# Patient Record
Sex: Female | Born: 1935 | Race: White | Hispanic: No
Health system: Southern US, Community
[De-identification: ages and names within clinical notes are randomized; demographics above are authoritative.]

## PROBLEM LIST (undated history)

## (undated) DIAGNOSIS — E162 Hypoglycemia, unspecified: Secondary | ICD-10-CM

## (undated) DIAGNOSIS — K219 Gastro-esophageal reflux disease without esophagitis: Secondary | ICD-10-CM

## (undated) DIAGNOSIS — C801 Malignant (primary) neoplasm, unspecified: Secondary | ICD-10-CM

## (undated) DIAGNOSIS — F319 Bipolar disorder, unspecified: Secondary | ICD-10-CM

---

## 2009-07-20 ENCOUNTER — Inpatient Hospital Stay: Payer: Self-pay | Admitting: Internal Medicine

## 2009-07-20 ENCOUNTER — Ambulatory Visit: Payer: Self-pay | Admitting: Family Medicine

## 2009-09-20 ENCOUNTER — Ambulatory Visit: Payer: Self-pay | Admitting: Family Medicine

## 2014-10-29 ENCOUNTER — Ambulatory Visit: Payer: Self-pay | Admitting: Internal Medicine

## 2014-11-23 ENCOUNTER — Inpatient Hospital Stay: Payer: Self-pay | Admitting: Internal Medicine

## 2014-11-23 LAB — COMPREHENSIVE METABOLIC PANEL
ANION GAP: 12 (ref 7–16)
Albumin: 3.6 g/dL (ref 3.4–5.0)
Alkaline Phosphatase: 97 U/L (ref 46–116)
BILIRUBIN TOTAL: 0.8 mg/dL (ref 0.2–1.0)
BUN: 21 mg/dL — AB (ref 7–18)
CREATININE: 0.89 mg/dL (ref 0.60–1.30)
Calcium, Total: 8.8 mg/dL (ref 8.5–10.1)
Chloride: 105 mmol/L (ref 98–107)
Co2: 24 mmol/L (ref 21–32)
EGFR (African American): >60
EGFR (Non-African Amer.): >60
GLUCOSE: 119 mg/dL — AB (ref 65–99)
Osmolality: 283 (ref 275–301)
POTASSIUM: 4 mmol/L (ref 3.5–5.1)
SGOT(AST): 50 U/L — ABNORMAL HIGH (ref 15–37)
SGPT (ALT): 34 U/L (ref 14–63)
Sodium: 141 mmol/L (ref 136–145)
TOTAL PROTEIN: 6.8 g/dL (ref 6.4–8.2)

## 2014-11-23 LAB — URINALYSIS, COMPLETE
Bacteria: NONE SEEN
Bilirubin,UR: NEGATIVE
Blood: NEGATIVE
Glucose,UR: NEGATIVE mg/dL (ref 0–75)
KETONE: NEGATIVE
Leukocyte Esterase: NEGATIVE
Nitrite: NEGATIVE
Ph: 5 (ref 4.5–8.0)
RBC,UR: <1 /HPF (ref 0–5)
Specific Gravity: 1.011 (ref 1.003–1.030)
Squamous Epithelial: NONE SEEN
WBC UR: <1 /HPF (ref 0–5)

## 2014-11-23 LAB — CBC WITH DIFFERENTIAL/PLATELET
Basophil #: 0.1 10*3/uL (ref 0.0–0.1)
Basophil %: 0.3 %
EOS ABS: 0.2 10*3/uL (ref 0.0–0.7)
Eosinophil %: 0.5 %
HCT: 35.6 % (ref 35.0–47.0)
HGB: 11.2 g/dL — ABNORMAL LOW (ref 12.0–16.0)
LYMPHS ABS: 23.9 10*3/uL — AB (ref 1.0–3.6)
LYMPHS PCT: 66.4 %
MCH: 28.9 pg (ref 26.0–34.0)
MCHC: 31.5 g/dL — AB (ref 32.0–36.0)
MCV: 92 fL (ref 80–100)
MONOS PCT: 5.6 %
Monocyte #: 2 x10 3/mm — ABNORMAL HIGH (ref 0.2–0.9)
NEUTROS ABS: 9.8 10*3/uL — AB (ref 1.4–6.5)
NEUTROS PCT: 27.2 %
PLATELETS: 249 10*3/uL (ref 150–440)
RBC: 3.88 10*6/uL (ref 3.80–5.20)
RDW: 22.1 % — ABNORMAL HIGH (ref 11.5–14.5)
WBC: 35 10*3/uL — AB (ref 3.6–11.0)

## 2014-11-23 LAB — PROTIME-INR
INR: 1
Prothrombin Time: 13.3 secs (ref 11.5–14.7)

## 2014-11-24 LAB — TSH: Thyroid Stimulating Horm: 0.428 u[IU]/mL — ABNORMAL LOW

## 2014-11-25 LAB — CBC WITH DIFFERENTIAL/PLATELET
Bands: 2 %
HCT: 32.9 % — ABNORMAL LOW (ref 35.0–47.0)
HGB: 10.4 g/dL — AB (ref 12.0–16.0)
Lymphocytes: 67 %
MCH: 28.8 pg (ref 26.0–34.0)
MCHC: 31.5 g/dL — ABNORMAL LOW (ref 32.0–36.0)
MCV: 91 fL (ref 80–100)
MONOS PCT: 3 %
Metamyelocyte: 1 %
Platelet: 226 10*3/uL (ref 150–440)
RBC: 3.6 10*6/uL — ABNORMAL LOW (ref 3.80–5.20)
RDW: 22.1 % — ABNORMAL HIGH (ref 11.5–14.5)
Segmented Neutrophils: 22 %
VARIANT LYMPHOCYTE - H1-RLYMPH: 5 %
WBC: 28.3 10*3/uL — ABNORMAL HIGH (ref 3.6–11.0)

## 2014-11-25 LAB — BASIC METABOLIC PANEL
Anion Gap: 12 (ref 7–16)
BUN: 13 mg/dL (ref 7–18)
CALCIUM: 8.9 mg/dL (ref 8.5–10.1)
CHLORIDE: 101 mmol/L (ref 98–107)
Co2: 26 mmol/L (ref 21–32)
Creatinine: 0.9 mg/dL (ref 0.60–1.30)
EGFR (African American): >60
EGFR (Non-African Amer.): >60
Glucose: 120 mg/dL — ABNORMAL HIGH (ref 65–99)
Osmolality: 279 (ref 275–301)
Potassium: 3 mmol/L — ABNORMAL LOW (ref 3.5–5.1)
Sodium: 139 mmol/L (ref 136–145)

## 2014-11-26 LAB — MAGNESIUM: MAGNESIUM: 1.4 mg/dL — AB

## 2014-11-27 LAB — CBC WITH DIFFERENTIAL/PLATELET
HCT: 31.9 % — ABNORMAL LOW (ref 35.0–47.0)
HGB: 10 g/dL — ABNORMAL LOW (ref 12.0–16.0)
Lymphocytes: 64 %
MCH: 28.4 pg (ref 26.0–34.0)
MCHC: 31.2 g/dL — ABNORMAL LOW (ref 32.0–36.0)
MCV: 91 fL (ref 80–100)
MYELOCYTE: 1 %
Monocytes: 3 %
PLATELETS: 202 10*3/uL (ref 150–440)
Promyelocyte: 1 %
RBC: 3.51 10*6/uL — ABNORMAL LOW (ref 3.80–5.20)
RDW: 21.6 % — AB (ref 11.5–14.5)
Segmented Neutrophils: 31 %
WBC: 26.9 10*3/uL — ABNORMAL HIGH (ref 3.6–11.0)

## 2014-11-28 LAB — CULTURE, BLOOD (SINGLE)

## 2014-11-29 ENCOUNTER — Ambulatory Visit: Payer: Self-pay | Admitting: Internal Medicine

## 2014-11-29 LAB — CBC WITH DIFFERENTIAL/PLATELET
HCT: 34 % — AB (ref 35.0–47.0)
HGB: 10.6 g/dL — AB (ref 12.0–16.0)
Lymphocytes: 66 %
MCH: 28.5 pg (ref 26.0–34.0)
MCHC: 31.3 g/dL — AB (ref 32.0–36.0)
MCV: 91 fL (ref 80–100)
MONOS PCT: 3 %
Platelet: 198 10*3/uL (ref 150–440)
RBC: 3.73 10*6/uL — ABNORMAL LOW (ref 3.80–5.20)
RDW: 22.1 % — ABNORMAL HIGH (ref 11.5–14.5)
Segmented Neutrophils: 31 %
WBC: 28.1 10*3/uL — ABNORMAL HIGH (ref 3.6–11.0)

## 2014-11-29 LAB — CREATININE, SERUM
Creatinine: 0.7 mg/dL (ref 0.60–1.30)
EGFR (African American): >60
EGFR (Non-African Amer.): >60

## 2014-11-29 LAB — MAGNESIUM: Magnesium: 1.9 mg/dL

## 2014-11-29 LAB — POTASSIUM: POTASSIUM: 3.2 mmol/L — AB (ref 3.5–5.1)

## 2014-12-08 ENCOUNTER — Emergency Department: Payer: Self-pay | Admitting: Emergency Medicine

## 2014-12-09 ENCOUNTER — Emergency Department: Payer: Self-pay | Admitting: Emergency Medicine

## 2014-12-10 ENCOUNTER — Emergency Department: Payer: Self-pay | Admitting: Emergency Medicine

## 2014-12-28 ENCOUNTER — Ambulatory Visit
Admit: 2014-12-28 | Disposition: A | Discharge status: Home / Self Care | Payer: Self-pay | Attending: Internal Medicine | Admitting: Internal Medicine

## 2015-01-10 ENCOUNTER — Emergency Department: Payer: Self-pay | Admitting: Emergency Medicine

## 2015-01-28 ENCOUNTER — Emergency Department
Admit: 2015-01-28 | Disposition: A | Discharge status: Skilled Nursing Facility | Payer: Self-pay | Admitting: Emergency Medicine

## 2015-01-28 LAB — COMPREHENSIVE METABOLIC PANEL
ALK PHOS: 94 U/L
ALT: 15 U/L
Albumin: 3 g/dL — ABNORMAL LOW
Anion Gap: 7 (ref 7–16)
BILIRUBIN TOTAL: 0.6 mg/dL
BUN: 37 mg/dL — ABNORMAL HIGH
CREATININE: 1.18 mg/dL — AB
Calcium, Total: 8.8 mg/dL — ABNORMAL LOW
Chloride: 100 mmol/L — ABNORMAL LOW
Co2: 34 mmol/L — ABNORMAL HIGH
EGFR (African American): 45 — ABNORMAL LOW
GFR CALC NON AF AMER: 39 — AB
GLUCOSE: 120 mg/dL — AB
POTASSIUM: 3.8 mmol/L
SGOT(AST): 23 U/L
Sodium: 141 mmol/L
Total Protein: 5.7 g/dL — ABNORMAL LOW

## 2015-01-28 LAB — CBC
HCT: 25.5 % — ABNORMAL LOW (ref 35.0–47.0)
HGB: 8.1 g/dL — ABNORMAL LOW (ref 12.0–16.0)
MCH: 28.5 pg (ref 26.0–34.0)
MCHC: 31.6 g/dL — AB (ref 32.0–36.0)
MCV: 90 fL (ref 80–100)
Platelet: 246 10*3/uL (ref 150–440)
RBC: 2.83 10*6/uL — ABNORMAL LOW (ref 3.80–5.20)
RDW: 21.1 % — ABNORMAL HIGH (ref 11.5–14.5)
WBC: 19 10*3/uL — ABNORMAL HIGH (ref 3.6–11.0)

## 2015-01-28 LAB — URINALYSIS, COMPLETE
BACTERIA: NONE SEEN
Bilirubin,UR: NEGATIVE
Blood: NEGATIVE
Glucose,UR: NEGATIVE mg/dL (ref 0–75)
Ketone: NEGATIVE
LEUKOCYTE ESTERASE: NEGATIVE
Nitrite: NEGATIVE
PH: 7 (ref 4.5–8.0)
Protein: NEGATIVE
RBC,UR: <1 /HPF (ref 0–5)
SPECIFIC GRAVITY: 1.012 (ref 1.003–1.030)
Squamous Epithelial: 1
WBC UR: <1 /HPF (ref 0–5)

## 2015-01-28 LAB — TROPONIN I: TROPONIN-I: 0.03 ng/mL

## 2015-02-02 LAB — CULTURE, BLOOD (SINGLE)

## 2015-02-27 NOTE — H&P (Signed)
PATIENT NAME:  Amy Hartman, Amy Hartman MR#:  [id number] DATE OF BIRTH:  Mar 24, 1918  DATE OF ADMISSION:  11/23/2014   REFERRING PHYSICIAN: Nance Pear, MD   PRIMARY CARE PHYSICIAN: Nonlocal.   ADMITTING DIAGNOSIS: Healthcare-associated pneumonia and hypothermia.   HISTORY OF PRESENT ILLNESS: This is a 81 year old Caucasian female who presents to the Emergency Department via EMS after being found wandering outside. The patient was nearly unresponsive upon arrival to the Emergency Department and had a core temperature of 94.1 degrees Fahrenheit.  The patient was immediately placed on the warming blanket. A chest x-ray revealed a right-sided middle lobe pneumonia and the patient was also placed on supplemental oxygen therapy.  Her son states the patient had been complaining of shortness of breath for the last 10 days.  She had some confusion during that time, but it was not dramatic.  Unfortunately, this is her third episode of wandering outside of her independent living apartment.  She has essentially been in her usual state of health lately, aside from complaining of insomnia.   REVIEW OF SYSTEMS:  CONSTITUTIONAL: The patient denies fever, but admits to generalized weakness.  EYES: Denies inflammation, but admits to poor visual acuity.  HEENT: Denies tinnitus or sore throat.  RESPIRATORY: Admits to shortness of breath and mild cough, which is nonproductive.   CARDIOVASCULAR: Denies chest pain, palpitations, orthopnea, paroxysmal nocturnal dyspnea. GASTROINTESTINAL: Denies nausea, vomiting, diarrhea, or abdominal pain.  GENITOURINARY: Denies dysuria, increased frequency, or hesitancy of urination. HEMATOLOGIC AND LYMPHATIC: Denies easy bruising or bleeding.  INTEGUMENTARY: Denies rashes or lesions.  MUSCULOSKELETAL: Denies arthralgias or myalgias.  NEUROLOGIC: Denies numbness in her extremities or difficulty speaking.   PSYCHIATRIC: Denies depression or suicidal ideation.   PAST MEDICAL HISTORY:  Hypertension, hypothyroidism, hyperlipidemia, macular degeneration, gait instability and known left bundle branch block.   PAST SURGICAL HISTORY: The patient had a left total knee replacement, varicose vein removal and repair, as well as a cholecystectomy.   SOCIAL HISTORY: The patient does not smoke or do any illegal drugs. She occasionally has a glass of wine and is able to complete her activities of daily living on her own. She lives in the independent living facility.   FAMILY HISTORY: Significant for hypertension.   MEDICATIONS:  1.  Aspirin 81 mg 1 tablet p.o. daily.  2.  Calcium supplement.  3.  Levothyroxine dose is unclear at this time.  4.  Lipitor 20 mg 1 tablet p.o. at bedtime.  5.  Lisinopril 20 mg 1 tablet p.o. daily.  6.  Maxzide 75 mg/50 mg oral tablet 1 tablet p.o. daily.  7.  Metoprolol, dose is unclear at this time.  8.  Multivitamin 1 tab p.o. daily.  9.  Nitroglycerin 0.4 mg sublingual tablet, 1 tablet p.o. every 5 minutes as needed for chest pain.  10. Zantac 150 mg delayed-release capsule 1 tablet p.o. b.i.d.   ALLERGIES: CODEINE.   PERTINENT LABORATORY RESULTS AND RADIOGRAPHIC FINDINGS: Serum glucose 119, BUN 21, creatinine 0.89, serum sodium 141, potassium 4, chloride 105, bicarbonate 24, calcium 8.8, serum albumin, 3.6, alkaline phosphatase 97, AST 50, ALT 34. White blood cell count 35, hemoglobin is 11.2, hematocrit 35.6, platelet count is 249,000.  MCV is 92. Urinalysis is negative for infection. Chest x-ray shows cardiac enlargement with pulmonary vascular congestion, perihilar edema and bilateral pleural effusions. There is some atelectasis or infiltration in the lung bases. CT of the head shows involutional changes, mild to moderate white matter changes consistent with chronic small vessel ischemic disease, but there  is no acute intracranial process.   PHYSICAL EXAMINATION:  VITAL SIGNS: Temperature is 94.1, pulse 72, respirations 24, blood pressure 155/82,  pulse oximetry is 78% on room air, which improves to 100% on 3 liters of oxygen via nasal cannula.  GENERAL: The patient is somnolent, but easily arousable. She is in no apparent distress.  HEENT: Normocephalic, atraumatic. Pupils equal, round, and reactive to light and accommodation. Extraocular movements are intact. Mucous membranes are moist.  NECK: Trachea is midline. No adenopathy. Thyroid is nonpalpable, nontender.  CHEST: Symmetric and atraumatic.  CARDIOVASCULAR: Regular rate and rhythm. Normal S1, S2. No rubs, clicks, or murmurs appreciated.  LUNGS: Somewhat diminished breath sounds bilaterally and on the right has some faint rhonchi. She is on nasal cannula at the time of physical examination.  ABDOMEN: Positive bowel sounds. Soft, nontender, nondistended. No hepatosplenomegaly.  GENITOURINARY: Deferred.  MUSCULOSKELETAL: The patient moves all 4 extremities equally but has 4/5 strength in upper and lower extremities bilaterally. This could be a finding due to decreased cooperation given her general somnolence and overall feeling of weakness.  SKIN: No rashes or lesions.  EXTREMITIES: No clubbing or cyanosis. The patient does have some nonpitting edema of her lower extremities to the knees.  NEUROLOGIC: Cranial nerves II through XII are grossly intact.  PSYCHIATRIC: Mood is normal. Affect is congruent.  The patient has good insight into her illness; however, it is difficult to assess her judgment given her state of confusion that led her to be outside in the first place.   ASSESSMENT AND PLAN: This is a 81 year old female with pneumonia and hypothermia.  1.  Pneumonia. The patient lives independently, but in a group apartment setting, thus we will treat her for healthcare-associated pneumonia.  In the Emergency Department, she was given Levaquin, vancomycin and Zosyn.  We will continue the latter 2 and switch the patient to oral azithromycin to substitute for Levaquin. We will give her  supplemental oxygen by nasal cannula and wean as tolerated.  We will continue to keep the patient on telemetry.  2.  Hypothermia. This is likely environmental, but in combination with the patient's leukocytosis. She does not meet septic criteria.  Blood cultures have been obtained. The patient may have a component of hypoxia secondary to her pneumonia which led to the confusion that may have led to her being outside, or her macular degeneration may have contributed to her inability to find her way back in the house at night, but it is clear that based on the freezing temperature outside this evening is certainly part of her low temperature is environmental.  3.  Hypothyroidism. We will continue Synthroid as soon as we verify the patient's dose.  4.  Hyperlipidemia. Will continue statin therapy.  5.  Deep vein thrombosis prophylaxis, heparin.  6.  Gastrointestinal prophylaxis: None.   CODE STATUS: The patient is a full code.   TIME SPENT ON ADMISSION ORDERS AND CRITICAL PATIENT CARE: Approximately 45 minutes.     ____________________________ Norva Riffle. Marcille Blanco, MD msd:DT D: 11/23/2014 06:50:26 ET T: 11/23/2014 08:28:26 ET JOB#: [id number]  cc: Norva Riffle. Marcille Blanco, MD, <Dictator> Debbrah Alar, MD [place] MD ELECTRONICALLY SIGNED 11/24/2014 1:58

## 2015-02-27 NOTE — Discharge Summary (Signed)
PATIENT NAME:  Amy Hartman, Amy Hartman MR#:  [id number] DATE OF BIRTH:  05/21/1918  DATE OF ADMISSION:  11/23/2014 DATE OF DISCHARGE:  11/30/2014  DISCHARGE DIAGNOSES: 1.  Bilateral community-acquired pneumonia.  2. Acute respiratory failure.  3.  Acute toxic metabolic encephalopathy.  4.  Dementia.  5.  Dehydration.  6.  Hypokalemia.  7.  Hypothyroidism.  8.  Gastroesophageal reflux disease.  9.  Hypertension.   DISCHARGE MEDICATIONS: 1.  Levothyroxine 88 mcg daily.  2.  Lipitor 20 mg daily.  3.  Amlodipine 5 mg daily.  4.  Metoprolol extended release 100 mg oral once a day.  5.  Isosorbide mononitrate 30 mg daily.  6.  Nexium 40 mg daily.  7.  Olanzapine 5 mg oral once a day.  8.  Augmentin 875 mg oral 2 times a day for 5 days.  9.  Ensure 240 mL oral 3 times a day.  10.  DuoNeb nebulizer 4 times a day as needed for shortness of breath.    DISCHARGE INSTRUCTIONS: Continuous oxygen 3 liters through nasal cannula. Low-sodium diet. Activity as tolerated with a walker. Follow up with primary care physician in 1-2 weeks.   IMAGING STUDIES: Chest x-ray showed bilateral pneumonia.   ADMITTING HISTORY AND PHYSICAL: Please see detailed H and P dictated previously. In brief, a 81 year old Caucasian female patient presented to the hospital with hypothermia, unresponsiveness. Chest x-ray revealed bilateral pneumonia and was admitted to the hospitalist service.   HOSPITAL COURSE: 1.  Bilateral pneumonia. The patient was initially started on broad-spectrum antibiotics which were later tapered down to Augmentin. The patient has improved well, but continues to be on 3 liters oxygen and with acute respiratory failure. Her saturations were 84% on room air on the day of discharge and home oxygen has been set up at discharge.  2.  Acute encephalopathy over dementia secondary to her pneumonia. The patient has improved well and by the day of discharge the patient still is mildly confused, but awake, eating,  and able to carry a conversation with her family.  3.  Hypertension, fairly controlled, was elevated when she was unable to take her medications due to being n.p.o., but is improving.   Prior to discharge, the patient's lungs sound significantly improved with decreased air entry at the bases, S1, S2 heard without any murmurs, is alert, awake. No edema.   The patient was seen by physical therapy prior to discharge. Skilled nursing facility was recommended, but as the patient had a bed available at assisted living facility in [facility], family has requested that we discharge patient to assisted living with home health PT and they would be able to increase the physical therapy by self-pay if needed in the future.   TIME SPENT ON DAY OF DISCHARGE IN DISCHARGE ACTIVITY: 40 minutes.   ____________________________ Leia Alf Tristian Bouska, MD srs:LT D: 12/01/2014 17:18:27 ET T: 12/01/2014 21:21:53 ET JOB#: [id number]  cc: Alveta Heimlich R. Syrianna Schillaci, MD, <Dictator> Neita Carp MD ELECTRONICALLY SIGNED 12/06/2014 3:55

## 2015-02-27 NOTE — Consult Note (Signed)
   Comments   I met with pt's son. He describes a pattern of confusion worsening over the past six months. Pt likely has underlying dementia. TSH noted. Would check B12 to complete workup.  recognizes that pt has delirium and may not return to her previous cognitive baseline or functioning. He has made arrangements for patient to be admitted to [facility] at discharge and seems to feel they will be able to [facility] care for her even if she is nonambulatory and agitated. I discussed use of olanzapine and he agrees with this. He would also be interested in keeping her over the weekend to see if we can control her symptoms. Son would agree with rehab if needed.  that son will be out of country for the next week. He will have limited availability but will give the nurse his daughter's number as a contact person.  follow pt's status.  Electronic Signatures: Borders, Kirt Boys (NP)  (Signed 29-Jan-16 12:09)  Authored: Palliative Care Phifer, Izora Gala (MD)  (Signed 29-Jan-16 13:41)  Authored: Palliative Care   Last Updated: 29-Jan-16 13:41 by Phifer, Izora Gala (MD)

## 2015-04-16 ENCOUNTER — Encounter: Payer: Self-pay | Admitting: Emergency Medicine

## 2015-04-16 ENCOUNTER — Emergency Department: Payer: Medicare Other

## 2015-04-16 ENCOUNTER — Other Ambulatory Visit: Payer: Self-pay

## 2015-04-16 ENCOUNTER — Emergency Department
Admission: EM | Admit: 2015-04-16 | Discharge: 2015-04-17 | Disposition: A | Discharge status: Home / Self Care | Payer: Medicare Other | Attending: Emergency Medicine | Admitting: Emergency Medicine

## 2015-04-16 DIAGNOSIS — R4182 Altered mental status, unspecified: Secondary | ICD-10-CM | POA: Diagnosis present

## 2015-04-16 DIAGNOSIS — Z79899 Other long term (current) drug therapy: Secondary | ICD-10-CM | POA: Diagnosis not present

## 2015-04-16 DIAGNOSIS — S069X0A Unspecified intracranial injury without loss of consciousness, initial encounter: Secondary | ICD-10-CM | POA: Insufficient documentation

## 2015-04-16 DIAGNOSIS — Y9389 Activity, other specified: Secondary | ICD-10-CM | POA: Diagnosis not present

## 2015-04-16 DIAGNOSIS — Y9289 Other specified places as the place of occurrence of the external cause: Secondary | ICD-10-CM | POA: Diagnosis not present

## 2015-04-16 DIAGNOSIS — Y998 Other external cause status: Secondary | ICD-10-CM | POA: Diagnosis not present

## 2015-04-16 DIAGNOSIS — F0390 Unspecified dementia without behavioral disturbance: Secondary | ICD-10-CM | POA: Diagnosis not present

## 2015-04-16 HISTORY — DX: Hypoglycemia, unspecified: E16.2

## 2015-04-16 HISTORY — DX: Bipolar disorder, unspecified: F31.9

## 2015-04-16 HISTORY — DX: Gastro-esophageal reflux disease without esophagitis: K21.9

## 2015-04-16 LAB — CBC WITH DIFFERENTIAL/PLATELET
Basophils Absolute: 0.2 10*3/uL — ABNORMAL HIGH (ref 0–0.1)
Basophils Relative: 1 %
EOS PCT: 1 %
Eosinophils Absolute: 0.2 10*3/uL (ref 0–0.7)
HCT: 26 % — ABNORMAL LOW (ref 35.0–47.0)
Hemoglobin: 8.2 g/dL — ABNORMAL LOW (ref 12.0–16.0)
LYMPHS ABS: 10 10*3/uL — AB (ref 1.0–3.6)
Lymphocytes Relative: 52 %
MCH: 30.5 pg (ref 26.0–34.0)
MCHC: 31.7 g/dL — ABNORMAL LOW (ref 32.0–36.0)
MCV: 96.2 fL (ref 80.0–100.0)
MONOS PCT: 7 %
Monocytes Absolute: 1.4 10*3/uL — ABNORMAL HIGH (ref 0.2–0.9)
Neutro Abs: 7.5 10*3/uL — ABNORMAL HIGH (ref 1.4–6.5)
Neutrophils Relative %: 39 %
PLATELETS: 186 10*3/uL (ref 150–440)
RBC: 2.71 MIL/uL — AB (ref 3.80–5.20)
RDW: 22.2 % — AB (ref 11.5–14.5)
WBC: 19.3 10*3/uL — ABNORMAL HIGH (ref 3.6–11.0)

## 2015-04-16 LAB — URINALYSIS COMPLETE WITH MICROSCOPIC (ARMC ONLY)
BILIRUBIN URINE: NEGATIVE
Bacteria, UA: NONE SEEN
Glucose, UA: NEGATIVE mg/dL
Hgb urine dipstick: NEGATIVE
KETONES UR: NEGATIVE mg/dL
Nitrite: NEGATIVE
Protein, ur: NEGATIVE mg/dL
Specific Gravity, Urine: 1.014 (ref 1.005–1.030)
pH: 5 (ref 5.0–8.0)

## 2015-04-16 LAB — LIPASE, BLOOD: LIPASE: 33 U/L (ref 22–51)

## 2015-04-16 LAB — GLUCOSE, CAPILLARY: GLUCOSE-CAPILLARY: 166 mg/dL — AB (ref 65–99)

## 2015-04-16 LAB — BASIC METABOLIC PANEL
ANION GAP: 3 — AB (ref 5–15)
BUN: 46 mg/dL — ABNORMAL HIGH (ref 6–20)
CHLORIDE: 108 mmol/L (ref 101–111)
CO2: 31 mmol/L (ref 22–32)
Calcium: 9 mg/dL (ref 8.9–10.3)
Creatinine, Ser: 1.05 mg/dL — ABNORMAL HIGH (ref 0.44–1.00)
GFR calc Af Amer: 51 mL/min — ABNORMAL LOW (ref >60)
GFR calc non Af Amer: 44 mL/min — ABNORMAL LOW (ref >60)
Glucose, Bld: 156 mg/dL — ABNORMAL HIGH (ref 65–99)
Potassium: 3.9 mmol/L (ref 3.5–5.1)
Sodium: 142 mmol/L (ref 135–145)

## 2015-04-16 LAB — HEPATIC FUNCTION PANEL
ALT: 34 U/L (ref 14–54)
AST: 40 U/L (ref 15–41)
Albumin: 3.7 g/dL (ref 3.5–5.0)
Alkaline Phosphatase: 135 U/L — ABNORMAL HIGH (ref 38–126)
Bilirubin, Direct: <0.1 mg/dL — ABNORMAL LOW (ref 0.1–0.5)
TOTAL PROTEIN: 6.8 g/dL (ref 6.5–8.1)
Total Bilirubin: 0.5 mg/dL (ref 0.3–1.2)

## 2015-04-16 LAB — TROPONIN I

## 2015-04-16 MED ORDER — HALOPERIDOL LACTATE 5 MG/ML IJ SOLN: 5.0000 mg | Freq: Once | INTRAMUSCULAR | Status: DC

## 2015-04-16 MED ORDER — LORAZEPAM 2 MG/ML IJ SOLN: 2.0000 mg | Freq: Once | INTRAMUSCULAR | Status: DC

## 2015-04-16 MED ORDER — SODIUM CHLORIDE 0.9 % IV BOLUS (SEPSIS)
1000.0000 mL | Freq: Once | INTRAVENOUS | Status: AC
  Administered 2015-04-16: 1000 mL via INTRAVENOUS

## 2015-04-16 NOTE — Discharge Instructions (Signed)
Concussion A concussion, or closed-head injury, is a brain injury caused by a direct blow to the head or by a quick and sudden movement (jolt) of the head or neck. Concussions are usually not life-threatening. Even so, the effects of a concussion can be serious. If you have had a concussion before, you are more likely to experience concussion-like symptoms after a direct blow to the head.  CAUSES  Direct blow to the head, such as from running into another player during a soccer game, being hit in a fight, or hitting your head on a hard surface.  A jolt of the head or neck that causes the brain to move back and forth inside the skull, such as in a car crash. SIGNS AND SYMPTOMS The signs of a concussion can be hard to notice. Early on, they may be missed by you, family members, and health care providers. You may look fine but act or feel differently. Symptoms are usually temporary, but they may last for days, weeks, or even longer. Some symptoms may appear right away while others may not show up for hours or days. Every head injury is different. Symptoms include:  Mild to moderate headaches that will not go away.  A feeling of pressure inside your head.  Having more trouble than usual:  Learning or remembering things you have heard.  Answering questions.  Paying attention or concentrating.  Organizing daily tasks.  Making decisions and solving problems.  Slowness in thinking, acting or reacting, speaking, or reading.  Getting lost or being easily confused.  Feeling tired all the time or lacking energy (fatigued).  Feeling drowsy.  Sleep disturbances.  Sleeping more than usual.  Sleeping less than usual.  Trouble falling asleep.  Trouble sleeping (insomnia).  Loss of balance or feeling lightheaded or dizzy.  Nausea or vomiting.  Numbness or tingling.  Increased sensitivity to:  Sounds.  Lights.  Distractions.  Vision problems or eyes that tire  easily.  Diminished sense of taste or smell.  Ringing in the ears.  Mood changes such as feeling sad or anxious.  Becoming easily irritated or angry for little or no reason.  Lack of motivation.  Seeing or hearing things other people do not see or hear (hallucinations). DIAGNOSIS Your health care provider can usually diagnose a concussion based on a description of your injury and symptoms. He or she will ask whether you passed out (lost consciousness) and whether you are having trouble remembering events that happened right before and during your injury. Your evaluation might include:  A brain scan to look for signs of injury to the brain. Even if the test shows no injury, you may still have a concussion.  Blood tests to be sure other problems are not present. TREATMENT  Concussions are usually treated in an emergency department, in urgent care, or at a clinic. You may need to stay in the hospital overnight for further treatment.  Tell your health care provider if you are taking any medicines, including prescription medicines, over-the-counter medicines, and natural remedies. Some medicines, such as blood thinners (anticoagulants) and aspirin, may increase the chance of complications. Also tell your health care provider whether you have had alcohol or are taking illegal drugs. This information may affect treatment.  Your health care provider will send you home with important instructions to follow.  How fast you will recover from a concussion depends on many factors. These factors include how severe your concussion is, what part of your brain was injured, your  may affect treatment.  · Your health care provider will send you home with important instructions to follow.  · How fast you will recover from a concussion depends on many factors. These factors include how severe your concussion is, what part of your brain was injured, your age, and how healthy you were before the concussion.  · Most people with mild injuries recover fully. Recovery can take time. In general, recovery is slower in older persons. Also, persons who have had a concussion in the past or have other medical problems may find that it takes longer to recover from their current injury.  HOME  CARE INSTRUCTIONS  General Instructions  · Carefully follow the directions your health care provider gave you.  · Only take over-the-counter or prescription medicines for pain, discomfort, or fever as directed by your health care provider.  · Take only those medicines that your health care provider has approved.  · Do not drink alcohol until your health care provider says you are well enough to do so. Alcohol and certain other drugs may slow your recovery and can put you at risk of further injury.  · If it is harder than usual to remember things, write them down.  · If you are easily distracted, try to do one thing at a time. For example, do not try to watch TV while fixing dinner.  · Talk with family members or close friends when making important decisions.  · Keep all follow-up appointments. Repeated evaluation of your symptoms is recommended for your recovery.  · Watch your symptoms and tell others to do the same. Complications sometimes occur after a concussion. Older adults with a brain injury may have a higher risk of serious complications, such as a blood clot on the brain.  · Tell your teachers, school nurse, school counselor, coach, athletic trainer, or work manager about your injury, symptoms, and restrictions. Tell them about what you can or cannot do. They should watch for:  · Increased problems with attention or concentration.  · Increased difficulty remembering or learning new information.  · Increased time needed to complete tasks or assignments.  · Increased irritability or decreased ability to cope with stress.  · Increased symptoms.  · Rest. Rest helps the brain to heal. Make sure you:  · Get plenty of sleep at night. Avoid staying up late at night.  · Keep the same bedtime hours on weekends and weekdays.  · Rest during the day. Take daytime naps or rest breaks when you feel tired.  · Limit activities that require a lot of thought or concentration. These include:  · Doing homework or job-related  work.  · Watching TV.  · Working on the computer.  · Avoid any situation where there is potential for another head injury (football, hockey, soccer, basketball, martial arts, downhill snow sports and horseback riding). Your condition will get worse every time you experience a concussion. You should avoid these activities until you are evaluated by the appropriate follow-up health care providers.  Returning To Your Regular Activities  You will need to return to your normal activities slowly, not all at once. You must give your body and brain enough time for recovery.  · Do not return to sports or other athletic activities until your health care provider tells you it is safe to do so.  · Ask your health care provider when you can drive, ride a bicycle, or operate heavy machinery. Your ability to react may be slower after a   belt when riding in a car.  Drinking alcohol only in moderation.  Wearing a helmet when biking, skiing, skateboarding, skating, or doing similar activities.  Avoiding activities that could lead to a second concussion, such as contact or recreational sports, until your health care provider says it is okay.  Taking safety measures in your home.  Remove clutter and tripping hazards from floors and stairways.  Use grab bars in bathrooms and handrails by stairs.  Place non-slip mats on floors and in bathtubs.  Improve lighting in dim areas. SEEK MEDICAL CARE IF:  You have increased problems paying attention or  concentrating.  You have increased difficulty remembering or learning new information.  You need more time to complete tasks or assignments than before.  You have increased irritability or decreased ability to cope with stress.  You have more symptoms than before. Seek medical care if you have any of the following symptoms for more than 2 weeks after your injury:  Lasting (chronic) headaches.  Dizziness or balance problems.  Nausea.  Vision problems.  Increased sensitivity to noise or light.  Depression or mood swings.  Anxiety or irritability.  Memory problems.  Difficulty concentrating or paying attention.  Sleep problems.  Feeling tired all the time. SEEK IMMEDIATE MEDICAL CARE IF:  You have severe or worsening headaches. These may be a sign of a blood clot in the brain.  You have weakness (even if only in one hand, leg, or part of the face).  You have numbness.  You have decreased coordination.  You vomit repeatedly.  You have increased sleepiness.  One pupil is larger than the other.  You have convulsions.  You have slurred speech.  You have increased confusion. This may be a sign of a blood clot in the brain.  You have increased restlessness, agitation, or irritability.  You are unable to recognize people or places.  You have neck pain.  It is difficult to wake you up.  You have unusual behavior changes.  You lose consciousness. MAKE SURE YOU:  Understand these instructions.  Will watch your condition.  Will get help right away if you are not doing well or get worse. Document Released: 01/05/2004 Document Revised: 10/20/2013 Document Reviewed: 05/07/2013 [facility] Patient Information 2015 [place], [place]. This information is not intended to replace advice given to you by your health care provider. Make sure you discuss any questions you have with your health care provider.  Dementia Dementia is a general term for problems with brain  function. A person with dementia has memory loss and a hard time with at least one other brain function such as thinking, speaking, or problem solving. Dementia can affect social functioning, how you do your job, your mood, or your personality. The changes may be hidden for a long time. The earliest forms of this disease are usually not detected by family or friends. Dementia can be:  Irreversible.  Potentially reversible.  Partially reversible.  Progressive. This means it can get worse over time. CAUSES  Irreversible dementia causes may include:  Degeneration of brain cells (Alzheimer disease or Lewy body dementia).  Multiple small strokes (vascular dementia).  Infection (chronic meningitis or Creutzfeldt-Jakob disease).  Frontotemporal dementia. This affects younger people, age 42 to 26, compared to those who have Alzheimer disease.  Dementia associated with other disorders like Parkinson disease, Huntington disease, or HIV-associated dementia. Potentially or partially reversible dementia causes may include:  Medicines.  Metabolic causes such as excessive alcohol intake, vitamin B12 deficiency, or thyroid  disease.  Masses or pressure in the brain such as a tumor, blood clot, or hydrocephalus. SIGNS AND SYMPTOMS  Symptoms are often hard to detect. Family members or coworkers may not notice them early in the disease process. Different people with dementia may have different symptoms. Symptoms can include:  A hard time with memory, especially recent memory. Long-term memory may not be impaired.  Asking the same question multiple times or forgetting something someone just said.  A hard time speaking your thoughts or finding certain words.  A hard time solving problems or performing familiar tasks (such as how to use a telephone).  Sudden changes in mood.  Changes in personality, especially increasing moodiness or mistrust.  Depression.  A hard time understanding complex  ideas that were never a problem in the past. DIAGNOSIS  There are no specific tests for dementia.   Your health care provider may recommend a thorough evaluation. This is because some forms of dementia can be reversible. The evaluation will likely include a physical exam and getting a detailed history from you and a family member. The history often gives the best clues and suggestions for a diagnosis.  Memory testing may be done. A detailed brain function evaluation called neuropsychologic testing may be helpful.  Lab tests and brain imaging (such as a CT scan or MRI scan) are sometimes important.  Sometimes observation and re-evaluation over time is very helpful. TREATMENT  Treatment depends on the cause.   If the problem is a vitamin deficiency, it may be helped or cured with supplements.  For dementias such as Alzheimer disease, medicines are available to stabilize or slow the course of the disease. There are no cures for this type of dementia.  Your health care provider can help direct you to groups, organizations, and other health care providers to help with decisions in the care of you or your loved one. HOME CARE INSTRUCTIONS The care of individuals with dementia is varied and dependent upon the progression of the dementia. The following suggestions are intended for the person living with, or caring for, the person with dementia.  Create a safe environment.  Remove the locks on bathroom doors to prevent the person from accidentally locking himself or herself in.  Use childproof latches on kitchen cabinets and any place where cleaning supplies, chemicals, or alcohol are kept.  Use childproof covers in unused electrical outlets.  Install childproof devices to keep doors and windows secured.  Remove stove knobs or install safety knobs and an automatic shut-off on the stove.  Lower the temperature on water heaters.  Label medicines and keep them locked up.  Secure knives,  lighters, matches, power tools, and guns, and keep these items out of reach.  Keep the house free from clutter. Remove rugs or anything that might contribute to a fall.  Remove objects that might break and hurt the person.  Make sure lighting is good, both inside and outside.  Install grab rails as needed.  Use a monitoring device to alert you to falls or other needs for help.  Reduce confusion.  Keep familiar objects and people around.  Use night lights or dim lights at night.  Label items or areas.  Use reminders, notes, or directions for daily activities or tasks.  Keep a simple, consistent routine for waking, meals, bathing, dressing, and bedtime.  Create a calm, quiet environment.  Place large clocks and calendars prominently.  Display emergency numbers and home address near all telephones.  Use cues to  establish different times of the day. An example is to open curtains to let the natural light in during the day.   Use effective communication.  Choose simple words and short sentences.  Use a gentle, calm tone of voice.  Be careful not to interrupt.  If the person is struggling to find a word or communicate a thought, try to provide the word or thought.  Ask one question at a time. Allow the person ample time to answer questions. Repeat the question again if the person does not respond.  Reduce nighttime restlessness.  Provide a comfortable bed.  Have a consistent nighttime routine.  Ensure a regular walking or physical activity schedule. Involve the person in daily activities as much as possible.  Limit napping during the day.  Limit caffeine.  Attend social events that stimulate rather than overwhelm the senses.  Encourage good nutrition and hydration.  Reduce distractions during meal times and snacks.  Avoid foods that are too hot or too cold.  Monitor chewing and swallowing ability.  Continue with routine vision, hearing, dental, and medical  screenings.  Give medicines only as directed by the health care provider.  Monitor driving abilities. Do not allow the person to drive when safe driving is no longer possible.  Register with an identification program which could provide location assistance in the event of a missing person situation. SEEK MEDICAL CARE IF:   New behavioral problems start such as moodiness, aggressiveness, or seeing things that are not there (hallucinations).  Any new problem with brain function happens. This includes problems with balance, speech, or falling a lot.  Problems with swallowing develop.  Any symptoms of other illness happen. Small changes or worsening in any aspect of brain function can be a sign that the illness is getting worse. It can also be a sign of another medical illness such as infection. Seeing a health care provider right away is important. SEEK IMMEDIATE MEDICAL CARE IF:   A fever develops.  New or worsened confusion develops.  New or worsened sleepiness develops.  Staying awake becomes hard to do. Document Released: 04/10/2001 Document Revised: 03/01/2014 Document Reviewed: 03/12/2011 [facility] Patient Information 2015 [place], [place]. This information is not intended to replace advice given to you by your health care provider. Make sure you discuss any questions you have with your health care provider.

## 2015-04-16 NOTE — ED Notes (Addendum)
Pt from [facility] with ams. Per ems pt with fall from wheelchair today around lunch. Ems states pt leaned to left and "slid out of the wheelchair" without known head trauma. Pt is currently answering yes and no to most questions with head nod or shake and will open eyes on command, but is otherwise non communicative. Pt is non verbal at this time.  Pt with bruising noted to right forearm, hematoma to right mid forearm, perrl 45mm and brisk.

## 2015-04-16 NOTE — ED Notes (Signed)
Patient transported to CT 

## 2015-04-16 NOTE — ED Provider Notes (Signed)
[facility] Emergency Department Provider Note  ____________________________________________  Time seen: 10:30 PM  I have reviewed the triage vital signs and the nursing notes.   HISTORY  Chief Complaint Altered Mental Status  history obtained from patient and son. History from patient limited by dementia   HPI Amy Hartman is a 81 y.o. female who is in her usual state of health this morning when she fell asleep in her wheelchair around lunchtime and fell forward out of the wheelchair hitting her head on the ground.She had no acute symptoms at that time and was awakened interacting normally, and ate her lunch. However later in the afternoon she seemed unresponsive and nonverbal. She is transported to the ED at that time for evaluation. On arrival, patient is answering yes no questions and denies any complaints.     Past Medical History  Diagnosis Date  . Bipolar 1 disorder   . GERD (gastroesophageal reflux disease)   . Hypoglycemia     There are no active problems to display for this patient.   No past surgical history on file.  Current Outpatient Rx  Name  Route  Sig  Dispense  Refill  . furosemide (LASIX) 20 MG tablet   Oral   Take 20 mg by mouth daily.         . isosorbide mononitrate (IMDUR) 30 MG 24 hr tablet   Oral   Take 30 mg by mouth daily.         . metoprolol succinate (TOPROL-XL) 100 MG 24 hr tablet   Oral   Take 100 mg by mouth daily. Take with or immediately following a meal.         . mirtazapine (REMERON) 15 MG tablet   Oral   Take 15 mg by mouth at bedtime.         Marland Kitchen OLANZapine (ZYPREXA) 5 MG tablet   Oral   Take 5 mg by mouth daily.         Marland Kitchen omeprazole (PRILOSEC) 20 MG capsule   Oral   Take 20 mg by mouth daily.         . potassium chloride (K-DUR) 10 MEQ tablet   Oral   Take 10 mEq by mouth daily.           Allergies Codeine  No family history on file.  Social History History   Substance Use Topics  . Smoking status: Not on file  . Smokeless tobacco: Not on file  . Alcohol Use: Not on file    Review of Systems  Constitutional: No fever or chills. No weight changes Eyes:No blurry vision or double vision.  ENT: No sore throat. Cardiovascular: No chest pain. Respiratory: No dyspnea or cough. Gastrointestinal: Negative for abdominal pain, vomiting and diarrhea.  No BRBPR or melena. Genitourinary: Negative for dysuria, urinary retention, bloody urine, or difficulty urinating. Musculoskeletal: Negative for back pain. No joint swelling or pain. Skin: Negative for rash. Neurological: Negative for headaches, focal weakness or numbness. Psychiatric:No anxiety or depression.   Endocrine:No hot/cold intolerance, changes in energy, or sleep difficulty.  10-point ROS otherwise negative.  ____________________________________________   PHYSICAL EXAM:  VITAL SIGNS: ED Triage Vitals  Enc Vitals Group     BP 04/16/15 2238 138/88 mmHg     Pulse Rate 04/16/15 2238 60     Resp 04/16/15 2238 14     Temp 04/16/15 2334 98 F (36.7 C)     Temp Source 04/16/15 2334 Rectal  SpO2 04/16/15 2238 100 %     Weight 04/16/15 2238 160 lb 5 oz (72.717 kg)     Height --      Head Cir --      Peak Flow --      Pain Score --      Pain Loc --      Pain Edu? --      Excl. in [place]? --      Constitutional: Alert and oriented to self. Lethargic, no distress. Eyes: No scleral icterus. No conjunctival pallor. PERRL. EOMI ENT   Head: Normocephalic and atraumatic.   Nose: No congestion/rhinnorhea. No septal hematoma   Mouth/Throat: MMM, no pharyngeal erythema. No peritonsillar mass. No uvula shift.   Neck: No stridor. No SubQ emphysema. No meningismus. Hematological/Lymphatic/Immunilogical: No cervical lymphadenopathy. Cardiovascular: RRR. Normal and symmetric distal pulses are present in all extremities. No murmurs, rubs, or gallops. Respiratory: Normal  respiratory effort without tachypnea nor retractions. Breath sounds are clear and equal bilaterally. No wheezes/rales/rhonchi. Gastrointestinal: Soft and nontender. No distention. There is no CVA tenderness.  No rebound, rigidity, or guarding. Genitourinary: deferred Musculoskeletal: Nontender with normal range of motion in all extremities. No joint effusions.  No lower extremity tenderness.  No edema. Neurologic:   Answers yes no questions, tells son she loves her. Moves all extremities, no focal weakness.  Skin:  Skin is warm, dry and intact. No rash noted.  No petechiae, purpura, or bullae. Psychiatric: Mood and affect are normal. Speech and behavior are normal. Patient exhibits appropriate insight and judgment.  ____________________________________________    LABS (pertinent positives/negatives) (all labs ordered are listed, but only abnormal results are displayed) Labs Reviewed  CBC WITH DIFFERENTIAL/PLATELET - Abnormal; Notable for the following:    WBC 19.3 (*)    RBC 2.71 (*)    Hemoglobin 8.2 (*)    HCT 26.0 (*)    MCHC 31.7 (*)    RDW 22.2 (*)    Neutro Abs 7.5 (*)    Lymphs Abs 10.0 (*)    Monocytes Absolute 1.4 (*)    Basophils Absolute 0.2 (*)    All other components within normal limits  GLUCOSE, CAPILLARY - Abnormal; Notable for the following:    Glucose-Capillary 166 (*)    All other components within normal limits  CULTURE, BLOOD (ROUTINE X 2)  CULTURE, BLOOD (ROUTINE X 2)  URINE CULTURE  BASIC METABOLIC PANEL  HEPATIC FUNCTION PANEL  LIPASE, BLOOD  TROPONIN I  URINALYSIS COMPLETEWITH MICROSCOPIC ([facility] ONLY)   ____________________________________________   EKG  Interpreted by me Normal sinus rhythm rate of 64, normal axis, left bundle branch block, normal ST segments and T waves.  ____________________________________________    RADIOLOGY  Chest x-ray suggestive of pulmonary edema X-ray right forearm unremarkable CT head  unremarkable  ____________________________________________   PROCEDURES  ____________________________________________   INITIAL IMPRESSION / ASSESSMENT AND PLAN / ED COURSE  Pertinent labs & imaging results that were available during my care of the patient were reviewed by me and considered in my medical decision making (see chart for details).  Patient transported for altered mental status after head trauma, however patient appears back to normal baseline. She likely has mild TBI and concussion. We'll check altered mental status panel to evaluate for any occult pathology that may be predisposing, but this is likely due to the patient's head trauma, and she is now back to baseline as confirmed by her son.  ----------------------------------------- 11:52 PM on 04/16/2015 -----------------------------------------  Care the patient is  signed out to Dr. Dahlia Client to follow up on her lab work. If there is no significant pathology, the patient should be stable for discharge back to [facility].  ____________________________________________   FINAL CLINICAL IMPRESSION(S) / ED DIAGNOSES  Final diagnoses:  Mild TBI, without loss of consciousness, initial encounter  Chronic dementia, without behavioral disturbance      Carrie Mew, MD 04/16/15 2352

## 2015-04-16 NOTE — ED Notes (Signed)
Second set of blood cultures sent.

## 2015-04-17 NOTE — ED Notes (Signed)
Pt readjusted in bed for comfort.  

## 2015-04-17 NOTE — ED Notes (Signed)
Ems here to transport pt. Discharge instructions, dnr given to ems. Attempted to call [facility] for report x4 without success.

## 2015-04-17 NOTE — ED Provider Notes (Signed)
-----------------------------------------   1:13 AM on 04/17/2015 -----------------------------------------  Assuming care from Dr. Joni Fears.  In short, Amy Hartman is a 81 y.o. female with a chief complaint of Altered Mental Status .  Refer to the original H&P for additional details.  The current plan of care is to follow up all of the results of the patient's blood work.  The patient's urinalysis is unremarkable her CMP is unremarkable and her CBC is unremarkable although the patient has a white blood cell count of 19 per her son she has a history of CLL and that is normal for her. The patient's H&H is also baseline compared to her labs done in April.  The patient will be discharged home.   Loney Hering, MD 04/17/15 [phone]

## 2015-04-17 NOTE — ED Notes (Signed)
Pt with eyes open at this time. Son at bedside. Pt remains non verbal, but will shake head yes and no.

## 2015-04-17 NOTE — ED Notes (Signed)
Attempt to call report to [facility] without success.

## 2015-04-17 NOTE — ED Notes (Signed)
Pt states "she's acting like herself now, do you know if they are going to discharge her?"

## 2015-04-17 NOTE — ED Notes (Signed)
Pt more alert. Speaking with son who is at bedside. Pt continues to deny pain.

## 2015-04-19 LAB — URINE CULTURE
Culture: >=100000
Special Requests: NORMAL

## 2015-04-22 LAB — CULTURE, BLOOD (ROUTINE X 2): Culture: NO GROWTH

## 2015-05-05 ENCOUNTER — Observation Stay
Admission: EM | Admit: 2015-05-05 | Discharge: 2015-05-30 | Disposition: E | Payer: Medicare Other | Attending: Internal Medicine | Admitting: Internal Medicine

## 2015-05-05 ENCOUNTER — Encounter: Payer: Self-pay | Admitting: *Deleted

## 2015-05-05 ENCOUNTER — Emergency Department: Payer: Medicare Other

## 2015-05-05 DIAGNOSIS — Z79899 Other long term (current) drug therapy: Secondary | ICD-10-CM | POA: Diagnosis not present

## 2015-05-05 DIAGNOSIS — E039 Hypothyroidism, unspecified: Secondary | ICD-10-CM | POA: Insufficient documentation

## 2015-05-05 DIAGNOSIS — Z66 Do not resuscitate: Secondary | ICD-10-CM | POA: Diagnosis not present

## 2015-05-05 DIAGNOSIS — X58XXXA Exposure to other specified factors, initial encounter: Secondary | ICD-10-CM | POA: Insufficient documentation

## 2015-05-05 DIAGNOSIS — I10 Essential (primary) hypertension: Secondary | ICD-10-CM | POA: Diagnosis not present

## 2015-05-05 DIAGNOSIS — F0390 Unspecified dementia without behavioral disturbance: Secondary | ICD-10-CM | POA: Insufficient documentation

## 2015-05-05 DIAGNOSIS — R4182 Altered mental status, unspecified: Secondary | ICD-10-CM | POA: Diagnosis present

## 2015-05-05 DIAGNOSIS — F319 Bipolar disorder, unspecified: Secondary | ICD-10-CM | POA: Diagnosis not present

## 2015-05-05 DIAGNOSIS — K219 Gastro-esophageal reflux disease without esophagitis: Secondary | ICD-10-CM | POA: Diagnosis not present

## 2015-05-05 DIAGNOSIS — T68XXXA Hypothermia, initial encounter: Secondary | ICD-10-CM | POA: Insufficient documentation

## 2015-05-05 DIAGNOSIS — Z856 Personal history of leukemia: Secondary | ICD-10-CM | POA: Diagnosis not present

## 2015-05-05 DIAGNOSIS — A419 Sepsis, unspecified organism: Principal | ICD-10-CM | POA: Diagnosis present

## 2015-05-05 DIAGNOSIS — R0902 Hypoxemia: Secondary | ICD-10-CM | POA: Insufficient documentation

## 2015-05-05 DIAGNOSIS — E162 Hypoglycemia, unspecified: Secondary | ICD-10-CM | POA: Diagnosis not present

## 2015-05-05 DIAGNOSIS — N12 Tubulo-interstitial nephritis, not specified as acute or chronic: Secondary | ICD-10-CM | POA: Insufficient documentation

## 2015-05-05 DIAGNOSIS — R6521 Severe sepsis with septic shock: Secondary | ICD-10-CM | POA: Insufficient documentation

## 2015-05-05 DIAGNOSIS — I959 Hypotension, unspecified: Secondary | ICD-10-CM | POA: Insufficient documentation

## 2015-05-05 HISTORY — DX: Malignant (primary) neoplasm, unspecified: C80.1

## 2015-05-05 LAB — COMPREHENSIVE METABOLIC PANEL
ALK PHOS: 134 U/L — AB (ref 38–126)
ALT: 49 U/L (ref 14–54)
AST: 53 U/L — AB (ref 15–41)
Albumin: 2.9 g/dL — ABNORMAL LOW (ref 3.5–5.0)
Anion gap: 8 (ref 5–15)
BUN: 62 mg/dL — ABNORMAL HIGH (ref 6–20)
CHLORIDE: 106 mmol/L (ref 101–111)
CO2: 30 mmol/L (ref 22–32)
Calcium: 8.8 mg/dL — ABNORMAL LOW (ref 8.9–10.3)
Creatinine, Ser: 1.58 mg/dL — ABNORMAL HIGH (ref 0.44–1.00)
GFR, EST AFRICAN AMERICAN: 31 mL/min — AB (ref >60)
GFR, EST NON AFRICAN AMERICAN: 27 mL/min — AB (ref >60)
GLUCOSE: 109 mg/dL — AB (ref 65–99)
POTASSIUM: 4.8 mmol/L (ref 3.5–5.1)
SODIUM: 144 mmol/L (ref 135–145)
Total Bilirubin: 1 mg/dL (ref 0.3–1.2)
Total Protein: 5.7 g/dL — ABNORMAL LOW (ref 6.5–8.1)

## 2015-05-05 LAB — PROTIME-INR
INR: 1.08
Prothrombin Time: 14.2 seconds (ref 11.4–15.0)

## 2015-05-05 LAB — CBC WITH DIFFERENTIAL/PLATELET
BAND NEUTROPHILS: 34 %
BASOS ABS: 0 10*3/uL (ref 0–0.1)
Basophils Relative: 0 %
EOS ABS: 0 10*3/uL (ref 0–0.7)
Eosinophils Relative: 0 %
HCT: 24.3 % — ABNORMAL LOW (ref 35.0–47.0)
Hemoglobin: 7.4 g/dL — ABNORMAL LOW (ref 12.0–16.0)
Lymphocytes Relative: 22 %
Lymphs Abs: 7.9 10*3/uL — ABNORMAL HIGH (ref 1.0–3.6)
MCH: 29.9 pg (ref 26.0–34.0)
MCHC: 30.5 g/dL — AB (ref 32.0–36.0)
MCV: 98.2 fL (ref 80.0–100.0)
METAMYELOCYTES PCT: 3 %
MONO ABS: 1.4 10*3/uL — AB (ref 0.2–0.9)
Monocytes Relative: 4 %
Neutro Abs: 26.7 10*3/uL — ABNORMAL HIGH (ref 1.4–6.5)
Neutrophils Relative %: 37 %
Platelets: 56 10*3/uL — ABNORMAL LOW (ref 150–440)
RBC: 2.48 MIL/uL — ABNORMAL LOW (ref 3.80–5.20)
RDW: 22.2 % — AB (ref 11.5–14.5)
WBC: 36 10*3/uL — ABNORMAL HIGH (ref 3.6–11.0)

## 2015-05-05 LAB — APTT: aPTT: 50 seconds — ABNORMAL HIGH (ref 24–36)

## 2015-05-05 LAB — URINALYSIS COMPLETE WITH MICROSCOPIC (ARMC ONLY)
Bilirubin Urine: NEGATIVE
Glucose, UA: NEGATIVE mg/dL
Hgb urine dipstick: NEGATIVE
Ketones, ur: NEGATIVE mg/dL
Nitrite: NEGATIVE
PROTEIN: 30 mg/dL — AB
Specific Gravity, Urine: 1.012 (ref 1.005–1.030)
Squamous Epithelial / LPF: NONE SEEN
pH: 7 (ref 5.0–8.0)

## 2015-05-05 LAB — TROPONIN I

## 2015-05-05 LAB — LACTIC ACID, PLASMA: Lactic Acid, Venous: 1.6 mmol/L (ref 0.5–2.0)

## 2015-05-05 LAB — LIPASE, BLOOD: LIPASE: 39 U/L (ref 22–51)

## 2015-05-05 MED ORDER — MORPHINE SULFATE (CONCENTRATE) 10 MG/0.5ML PO SOLN: 5.0000 mg | ORAL | Status: DC | PRN

## 2015-05-05 MED ORDER — LORAZEPAM 2 MG/ML IJ SOLN: 1.0000 mg | INTRAMUSCULAR | Status: DC | PRN

## 2015-05-05 MED ORDER — SCOPOLAMINE 1 MG/3DAYS TD PT72: 1.0000 | MEDICATED_PATCH | TRANSDERMAL | Status: DC

## 2015-05-05 MED ORDER — ACETAMINOPHEN 650 MG RE SUPP: 650.0000 mg | Freq: Four times a day (QID) | RECTAL | Status: DC | PRN

## 2015-05-05 MED ORDER — SODIUM CHLORIDE 0.9 % IV BOLUS (SEPSIS)
1000.0000 mL | INTRAVENOUS | Status: AC
  Administered 2015-05-05 (×2): 1000 mL via INTRAVENOUS

## 2015-05-05 MED ORDER — LORAZEPAM 1 MG PO TABS: 1.0000 mg | ORAL_TABLET | ORAL | Status: DC | PRN

## 2015-05-05 MED ORDER — ACETAMINOPHEN 325 MG PO TABS: 650.0000 mg | ORAL_TABLET | Freq: Four times a day (QID) | ORAL | Status: DC | PRN

## 2015-05-05 MED ORDER — VANCOMYCIN HCL IN DEXTROSE 1-5 GM/200ML-% IV SOLN
INTRAVENOUS | Status: AC
  Administered 2015-05-05: 1000 mg via INTRAVENOUS
  Filled 2015-05-05: qty 200

## 2015-05-05 MED ORDER — SODIUM CHLORIDE 0.9 % IV BOLUS (SEPSIS)
500.0000 mL | INTRAVENOUS | Status: AC
  Administered 2015-05-05: 500 mL via INTRAVENOUS

## 2015-05-05 MED ORDER — PIPERACILLIN-TAZOBACTAM 3.375 G IVPB
INTRAVENOUS | Status: AC
  Administered 2015-05-05: 3.375 g via INTRAVENOUS
  Filled 2015-05-05: qty 50

## 2015-05-05 MED ORDER — LORAZEPAM 2 MG/ML PO CONC: 1.0000 mg | ORAL | Status: DC | PRN

## 2015-05-05 MED ORDER — PIPERACILLIN-TAZOBACTAM 3.375 G IVPB 30 MIN
3.3750 g | Freq: Once | INTRAVENOUS | Status: AC
  Administered 2015-05-05: 3.375 g via INTRAVENOUS

## 2015-05-05 MED ORDER — VANCOMYCIN HCL IN DEXTROSE 1-5 GM/200ML-% IV SOLN
1000.0000 mg | Freq: Once | INTRAVENOUS | Status: AC
  Administered 2015-05-05: 1000 mg via INTRAVENOUS

## 2015-05-05 MED ORDER — MORPHINE SULFATE 2 MG/ML IJ SOLN: 1.0000 mg | INTRAMUSCULAR | Status: DC | PRN

## 2015-05-08 LAB — CULTURE, BLOOD (ROUTINE X 2)

## 2015-05-08 LAB — URINE CULTURE: Culture: >=100000

## 2015-05-20 LAB — CULTURE, BLOOD (ROUTINE X 2)

## 2015-05-30 NOTE — ED Notes (Signed)
Per EMS report, the patient who is a resident of [facility] home, was found by staff to be unresponsive and low O2 sats of 88%. Patient is on 2L O2 via [place] constantly. Report was unclear if patient had her O2 on when found this a.m. Staff told EMS that patient is usually interactive with staff and can be hostile. Per EMS patient was 90% on 4L in the ambulance.

## 2015-05-30 NOTE — ED Notes (Signed)
Dr. Joni Fears and patient's son Yvone Neu are at bedside.

## 2015-05-30 NOTE — ED Notes (Signed)
Patient pronounced deceased by Dr. Volanda Napoleon.

## 2015-05-30 NOTE — ED Notes (Signed)
Internal temperature probe placed.

## 2015-05-30 NOTE — Consult Note (Signed)
[place] [facility] [place] NAME: Amy Hartman    MR#:  [id number]  DATE OF BIRTH:  [birth year]  DATE OF ADMISSION:  05/26/2015  PRIMARY CARE PHYSICIAN: No primary care provider on file.   REQUESTING/REFERRING PHYSICIAN: Dr. Joni Fears  CHIEF COMPLAINT:   Chief Complaint  Patient presents with  . Altered Mental Status    HISTORY OF PRESENT ILLNESS:  Amy Hartman  is a 81 y.o. female with a known history of CML, dementia, hypothyroidism, hypertension who presents from nursing home today unresponsive. On presentation to the emergency room she is found to be hypotensive initial blood pressures 125/45 progressing to 44/32 despite fluid resuscitation, hypothermic with temperature 91.2 on presentation, hypoxic with oxygenation of 87%. Initial evaluation shows urinary tract infection with too numerous to count white blood cells, progressed leukocytosis. She remained in the emergency room for treatment of sepsis and received broad spectrum anabiotic's and fluid resuscitation but did not respond. At that time the decision was made to admit her to the hospital for comfort care. I did evaluate the patient and speak at length with her son. Immediately after my evaluation the patient passed at 1645 in the emergency room. She was not admitted to the floor  PAST MEDICAL HISTORY:   Past Medical History  Diagnosis Date  . Bipolar 1 disorder   . GERD (gastroesophageal reflux disease)   . Hypoglycemia   . Cancer     PAST SURGICAL HISTORY:  History reviewed. No pertinent past surgical history.  SOCIAL HISTORY:   History  Substance Use Topics  . Smoking status: Unknown If Ever Smoked  . Smokeless tobacco: Not on file  . Alcohol Use: Not on file    FAMILY HISTORY:  No family history on file.  DRUG ALLERGIES:   Allergies  Allergen Reactions  . Codeine     REVIEW OF SYSTEMS:   ROS   Unable to obtain due to patient altered mental  status  MEDICATIONS AT HOME:   Prior to Admission medications   Medication Sig Start Date End Date Taking? Authorizing Provider  furosemide (LASIX) 20 MG tablet Take 20 mg by mouth daily.   Yes Historical Provider, MD  isosorbide mononitrate (IMDUR) 30 MG 24 hr tablet Take 30 mg by mouth daily.   Yes Historical Provider, MD  mirtazapine (REMERON) 15 MG tablet Take 15 mg by mouth at bedtime.   Yes Historical Provider, MD  OLANZapine (ZYPREXA) 5 MG tablet Take 5 mg by mouth daily.   Yes Historical Provider, MD  omeprazole (PRILOSEC) 20 MG capsule Take 20 mg by mouth daily.   Yes Historical Provider, MD  ondansetron (ZOFRAN) 4 MG tablet Take 4 mg by mouth every 8 (eight) hours as needed for nausea or vomiting.   Yes Historical Provider, MD  potassium chloride (K-DUR) 10 MEQ tablet Take 10 mEq by mouth daily.   Yes Historical Provider, MD      VITAL SIGNS:  Blood pressure 44/32, pulse 61, temperature 92.2 F (33.4 C), temperature source Rectal, resp. rate 25, weight 72.848 kg (160 lb 9.6 oz), SpO2 91 %.  PHYSICAL EXAMINATION:  GENERAL:  81 y.o.-year-old patient lying in the bed likely ill-appearing EYES: Pupils equal, round, not reactive to light and accommodation. No scleral icterus.  HEENT: Head atraumatic, normocephalic. Oropharynx and nasopharynx clear.  NECK:  Supple, no jugular venous distention. No thyroid enlargement, no tenderness.  LUNGS: Short shallow respirations, no respiratory distress, no wheezing rhonchi she does have crackles  CARDIOVASCULAR: Distant, regular, bradycardic, 4/6 systolic ejection murmur  ABDOMEN: Soft, nontender, nondistended. Bowel sounds decreased. No organomegaly or mass.  EXTREMITIES: 2+ pedal edema, cyanosis, or clubbing.  NEUROLOGIC: Obtunded. Does not respond to voice or pain  PSYCHIATRIC: Obtunded SKIN: Pale skin, bruising over the right arm left knee some abrasions over the same areas. No signs of infection  LABORATORY PANEL:   CBC  Recent  Labs Lab 05/19/15 1237  WBC 36.0*  HGB 7.4*  HCT 24.3*  PLT 56*   ------------------------------------------------------------------------------------------------------------------  Chemistries   Recent Labs Lab 2015/05/19 1237  NA 144  K 4.8  CL 106  CO2 30  GLUCOSE 109*  BUN 62*  CREATININE 1.58*  CALCIUM 8.8*  AST 53*  ALT 49  ALKPHOS 134*  BILITOT 1.0   ------------------------------------------------------------------------------------------------------------------  Cardiac Enzymes  Recent Labs Lab 2015/05/19 1237  TROPONINI <0.03   ------------------------------------------------------------------------------------------------------------------  RADIOLOGY:  Dg Chest Port 1 View  05-19-2015   CLINICAL DATA:  Altered mental status  EXAM: PORTABLE CHEST - 1 VIEW  COMPARISON:  04/16/2015  FINDINGS: Cardiac enlargement. Pulmonary vascular congestion with improvement in edema since the prior study.  Bibasilar atelectasis left greater than right. Possible small left effusion.  IMPRESSION: Cardiac enlargement with vascular congestion. Resolving pulmonary edema since the prior study  Bibasilar atelectasis left greater than right.   Electronically Signed   By: Franchot Gallo M.D.   On: May 19, 2015 13:17    EKG:   Orders placed or performed during the hospital encounter of 19-May-2015  . EKG 12-Lead  . EKG 12-Lead  . EKG 12-Lead  . EKG 12-Lead    IMPRESSION AND PLAN:   Sepsis likely due to urinary tract infection. Amy Hartman had a DO NOT RESUSCITATE order in place for many years. She was treated with sepsis protocol in the emergency room with broad spectrum anabiotic's and fluid resuscitation. She did not respond well. The decision was made not to pursue aggressive treatment. I did evaluate the patient and and discuss comfort care with her son. She passed away prior to being transferred to the floor.  All the records are reviewed and case discussed with ED  provider. Management plans discussed with the patient, family and they are in agreement.  CODE STATUS: DNR  TOTAL TIME TAKING CARE OF THIS PATIENT: 40 minutes.    Myrtis Ser M.D on 2015/05/19 at 4:50 PM  Between 7am to 6pm - Pager - [phone]  After 6pm go to [email or web address] - password [email or web address] [facility]  [place] [facility] [facility]  Office  [phone]  CC: Primary care physician; No primary care provider on file.

## 2015-05-30 NOTE — ED Provider Notes (Signed)
[facility] Emergency Department Provider Note  ____________________________________________  Time seen: 12:10 PM, on arrival by EMS  I have reviewed the triage vital signs and the nursing notes.   HISTORY  Chief Complaint Altered Mental Status  history and exam limited by patient obtundation Patient critically ill  HPI Amy Hartman is a 81 y.o. female who is brought from nursing home where staff found her to be unresponsive. EMS reports a low oxygen saturation of 88% on HER 2 liters nasal cannula. This improved with supplemental oxygen. They also noted her to be hypotensive and cool to the touch en route.Patient has not been able to relate any specific symptoms.     Past Medical History  Diagnosis Date  . Bipolar 1 disorder   . GERD (gastroesophageal reflux disease)   . Hypoglycemia     There are no active problems to display for this patient.   History reviewed. No pertinent past surgical history.  Current Outpatient Rx  Name  Route  Sig  Dispense  Refill  . furosemide (LASIX) 20 MG tablet   Oral   Take 20 mg by mouth daily.         . isosorbide mononitrate (IMDUR) 30 MG 24 hr tablet   Oral   Take 30 mg by mouth daily.         . metoprolol succinate (TOPROL-XL) 100 MG 24 hr tablet   Oral   Take 100 mg by mouth daily. Take with or immediately following a meal.         . mirtazapine (REMERON) 15 MG tablet   Oral   Take 15 mg by mouth at bedtime.         Marland Kitchen OLANZapine (ZYPREXA) 5 MG tablet   Oral   Take 5 mg by mouth daily.         Marland Kitchen omeprazole (PRILOSEC) 20 MG capsule   Oral   Take 20 mg by mouth daily.         . potassium chloride (K-DUR) 10 MEQ tablet   Oral   Take 10 mEq by mouth daily.           Allergies Codeine  No family history on file.  Social History History  Substance Use Topics  . Smoking status: Unknown If Ever Smoked  . Smokeless tobacco: Not on file  . Alcohol Use: Not on file    Review  of Systems Unable to obtain due to patient obtunded and critically ill ____________________________________________   PHYSICAL EXAM:  VITAL SIGNS: ED Triage Vitals  Enc Vitals Group     BP 06/04/2015 1225 125/45 mmHg     Pulse Rate 06/04/15 1225 50     Resp June 04, 2015 1225 24     Temp Jun 04, 2015 1225 91.2 F (32.9 C)     Temp Source Jun 04, 2015 1225 Rectal     SpO2 2015/06/04 1225 87 %     Weight 06/04/15 1225 160 lb 9.6 oz (72.848 kg)     Height --      Head Cir --      Peak Flow --      Pain Score --      Pain Loc --      Pain Edu? --      Excl. in [place]? --      Constitutional: Obtunded, calm and lying still. Eyes: No scleral icterus. No conjunctival pallor. PERRL.  ENT   Head: Normocephalic and atraumatic.   Nose: No congestion/rhinnorhea. No septal hematoma  Mouth/Throat: Dry mucous membranes, no pharyngeal erythema. No peritonsillar mass. No uvula shift.   Neck: No stridor. No SubQ emphysema. No meningismus. Hematological/Lymphatic/Immunilogical: No cervical lymphadenopathy. Cardiovascular: Tachycardia heart rate 110. Normal and symmetric distal pulses are present in all extremities. No murmurs, rubs, or gallops. Respiratory: Normal respiratory effort without tachypnea nor retractions. Breath sounds are clear and equal bilaterally. No wheezes/rales/rhonchi. Gastrointestinal: Soft with lower abdominal tenderness. No distention. There is no CVA tenderness.  No rebound, rigidity, or guarding. Genitourinary: deferred Musculoskeletal: Nontender and without focal areas of inflammation. There is 2+ pitting edema daily on bilateral lower extremities  Neurologic:   Obtunded.  GCS = E1 V1 M4 equals 6  Skin:  Skin is cool and covered and lacy reticular discoloration. Dependent areas of the back are mottled purple .  ____________________________________________    LABS (pertinent positives/negatives) (all labs ordered are listed, but only abnormal results are  displayed) Labs Reviewed  CULTURE, BLOOD (ROUTINE X 2)  CULTURE, BLOOD (ROUTINE X 2)  CULTURE, EXPECTORATED SPUTUM-ASSESSMENT  URINE CULTURE  LACTIC ACID, PLASMA  LACTIC ACID, PLASMA  COMPREHENSIVE METABOLIC PANEL  LIPASE, BLOOD  TROPONIN I  CBC WITH DIFFERENTIAL/PLATELET  APTT  PROTIME-INR  URINALYSIS COMPLETEWITH MICROSCOPIC ([facility] ONLY)   ____________________________________________   EKG  Interpreted by me Normal sinus rhythm rate of 83, occasional PVC, left axis, left bundle branch block, normal ST segments and T waves.  ____________________________________________    RADIOLOGY  Chest x-ray unremarkable  ____________________________________________   PROCEDURES CRITICAL CARE Performed by: Joni Fears, Amilcar Reever   Total critical care time: 35 minutes  Critical care time was exclusive of separately billable procedures and treating other patients.  Critical care was necessary to treat or prevent imminent or life-threatening deterioration.  Critical care was time spent personally by me on the following activities: development of treatment plan with patient and/or surrogate as well as nursing, discussions with consultants, evaluation of patient's response to treatment, examination of patient, obtaining history from patient or surrogate, ordering and performing treatments and interventions, ordering and review of laboratory studies, ordering and review of radiographic studies, pulse oximetry and re-evaluation of patient's condition.  ____________________________________________   INITIAL IMPRESSION / ASSESSMENT AND PLAN / ED COURSE  Pertinent labs & imaging results that were available during my care of the patient were reviewed by me and considered in my medical decision making (see chart for details).  Patient presents with apparent sepsis. Oxygen saturation 87%. Cor temp 91% by rectal temperature. Tachycardic to 110. Initial blood pressure is normal. We will  initiate code sepsis given. Vancomycin and Zosyn, and start aggressive IV fluid resuscitation.  ----------------------------------------- 1:24 PM on 05/31/15 -----------------------------------------  Patient reexamined. Mental status does not improve. Blood pressure now approximately 55/30. Warming blanket is in place, IV fluids running through pressure bags without much improvement in blood pressure. The patient sepsis has apparently progressed to septic shock. She does have a DO NOT RESUSCITATE order, so we will manage expectantly and not intubate or do any major invasive and aggressive measures particularly with her severe comorbidities and age and very low chance of surviving this illness. We'll concentrate on comfort measures. I did attempt to reach her family calling the emergency contact Berneta Levins on file, but was unable to reach anyone. I left a message for them to return the call as soon as possible. It is my expectation that the patient may expire within the next few hours, and this is unlikely to be avoided with even aggressive interventions such as central venous  catheter placement and vasopressor use. The patient has advanced directives against intubation and resuscitation in the event of cardiopulmonary arrest, and we will respect her wishes.Marland Kitchen  ----------------------------------------- 3:13 PM on 05/22/2015 -----------------------------------------  Urinalysis reveals a significant urinary tract infection confirming septic shock due to pyelonephritis. The patient's current condition was discussed with the son in the family room and he is brought to the bedside. He confirms the patient is DO NOT RESUSCITATE and that we should respect his wishes. He agrees with comfort measures only at this time. I discussed the patient's care with Dr. Megan Salon of palliative care medicine who will consult on the patient I will plan to admit the patient to the hospital for expectant management. .  ____________________________________________   FINAL CLINICAL IMPRESSION(S) / ED DIAGNOSES  Final diagnoses:  Septic shock   hypothermia Pyelonephritis   Carrie Mew, MD 22-May-2015 [phone]

## 2015-05-30 NOTE — ED Notes (Signed)
Call made to [facility] and spoke with Onalee Hua who stated that the patient's son Yvone Neu is coming to the hospital.

## 2015-05-30 DEATH — deceased

## 2016-06-15 IMAGING — CR DG FOREARM 2V*R*
2 series · 2 of 2 positions shown · non-contrast
Comparison: None.

CLINICAL DATA: [AGE] female fell from wheelchair. Pain and
bruising. Initial encounter.

EXAM:
RIGHT FOREARM - 2 VIEW

[forearm ap]
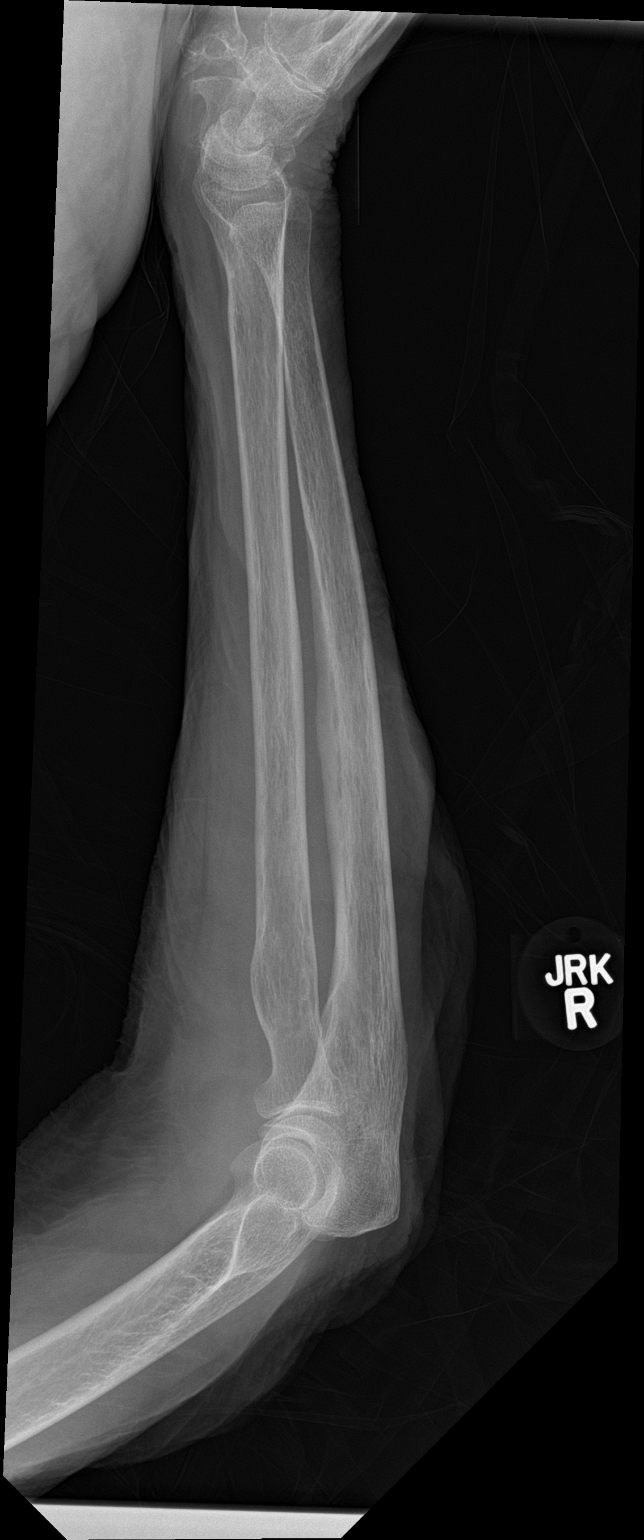

[forearm lat]
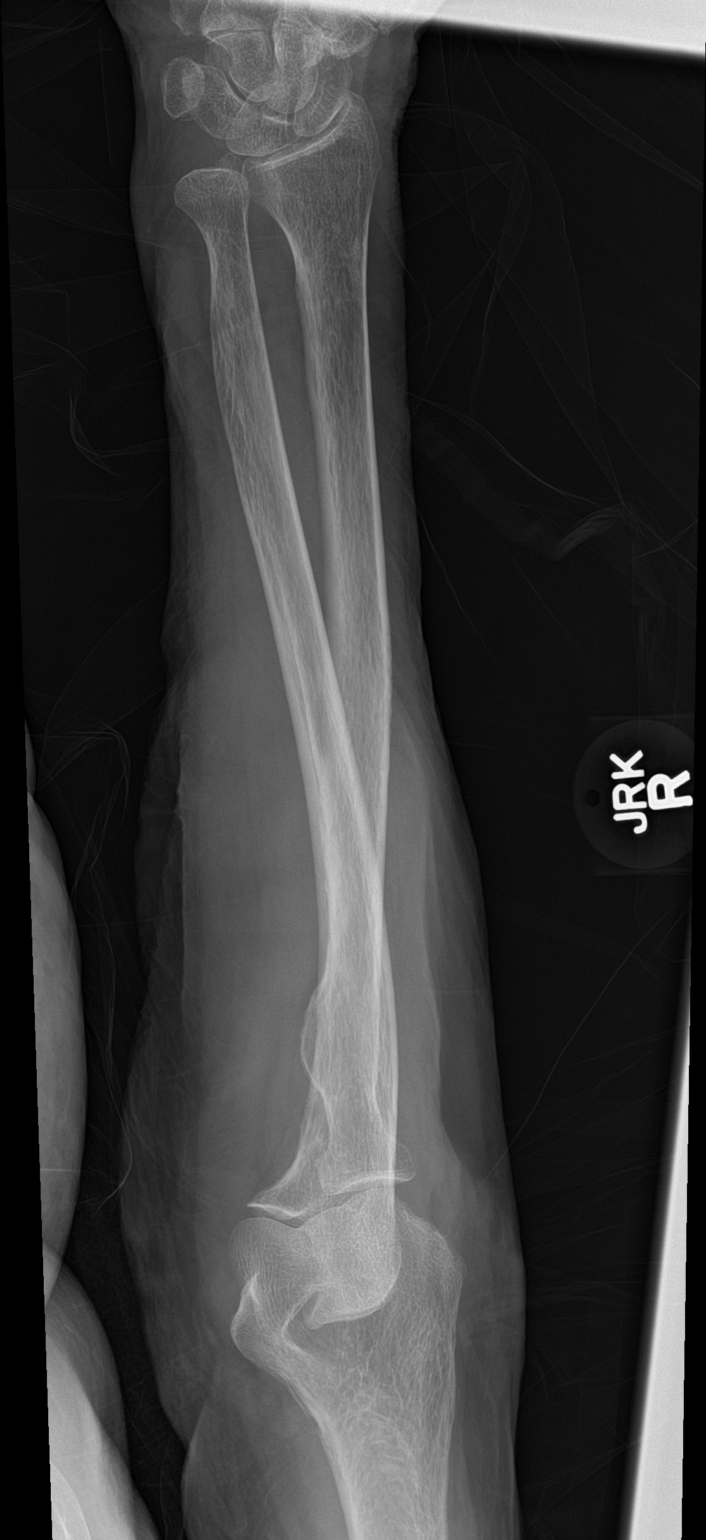

[2 of 2 positions shown; findings below may reference images not displayed]

FINDINGS: Osteopenia. Grossly normal alignment at the right wrist and elbow.
No radius or ulna fracture identified. Partially visible
degenerative changes along the radial carpal bones. No definite
elbow joint effusion.
IMPRESSION: No acute fracture or dislocation identified about the right forearm.

## 2016-06-15 IMAGING — CT CT HEAD W/O CM
3 series · 17 of 30 positions shown, 18 images · non-contrast
Comparison: 01/28/2015

CLINICAL DATA: Fell from wheelchair this afternoon. Altered mental
status since.

EXAM:
CT HEAD WITHOUT CONTRAST
TECHNIQUE: Contiguous axial images were obtained from the base of the skull
through the vertex without intravenous contrast.

[Series 2: head wo · axial · 0.51mm/px · z∈[-194,-94]mm · 5 of 31 slices shown]
[im 6/31  brain]
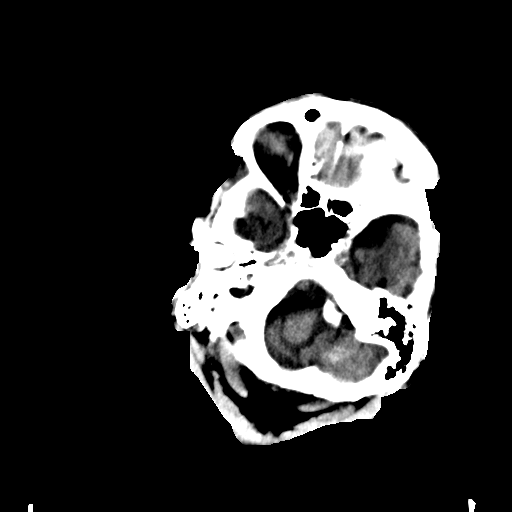
[im 11/31  brain]
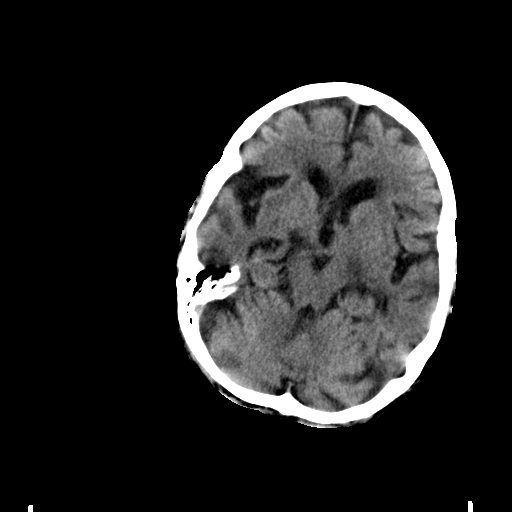
[im 16/31  brain]
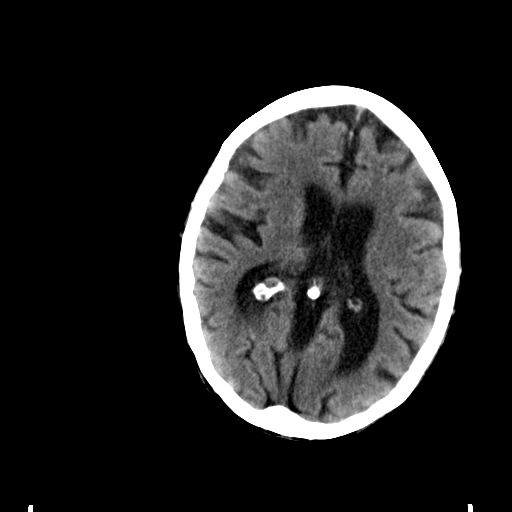
[im 21/31  brain]
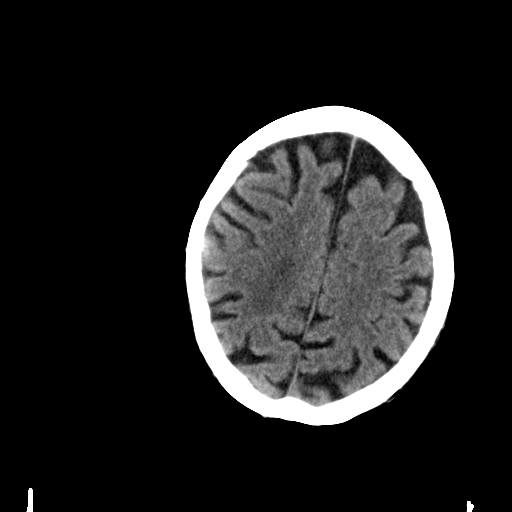
[im 26/31  brain]
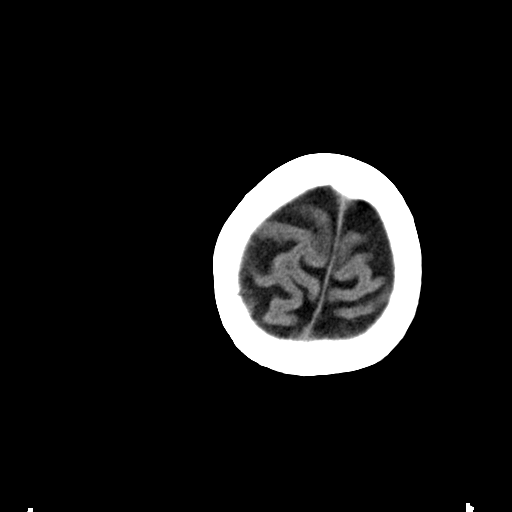

[Series 3: head bone · axial · 0.51mm/px · z∈[-220,-70]mm · 8 of 87 slices shown]
[im 6/87  bone]
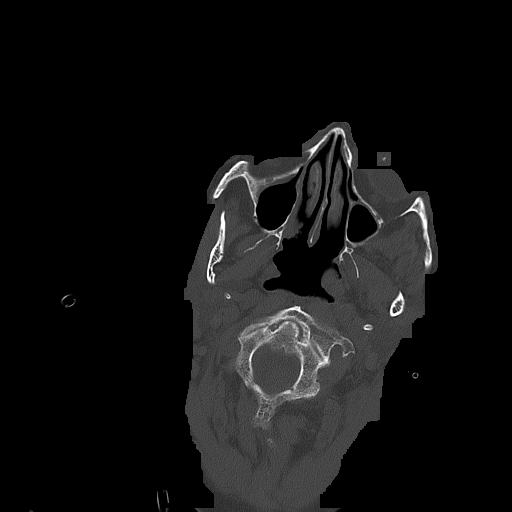
[im 17/87  bone]
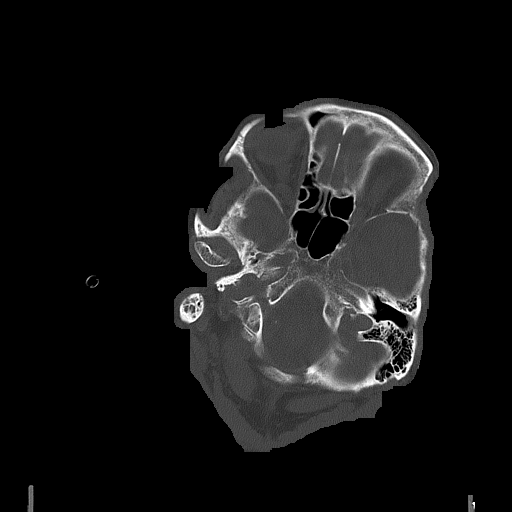
[im 27/87  bone]
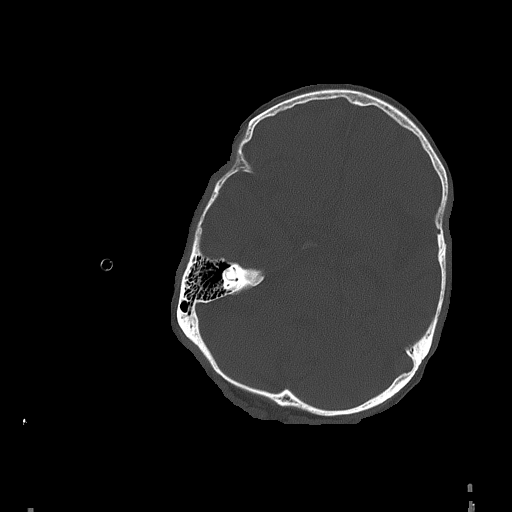
[im 38/87  bone]
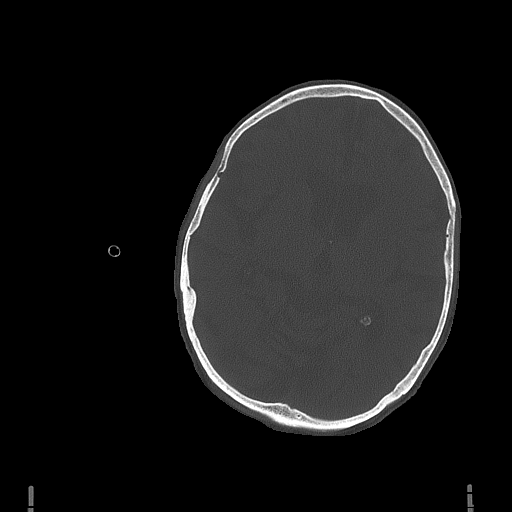
[im 49/87  bone]
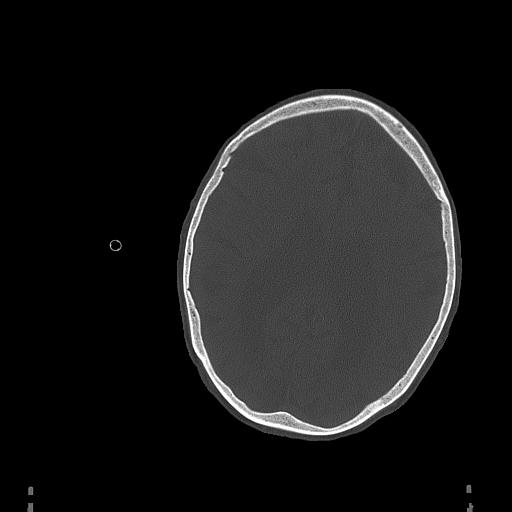
[im 60/87  bone]
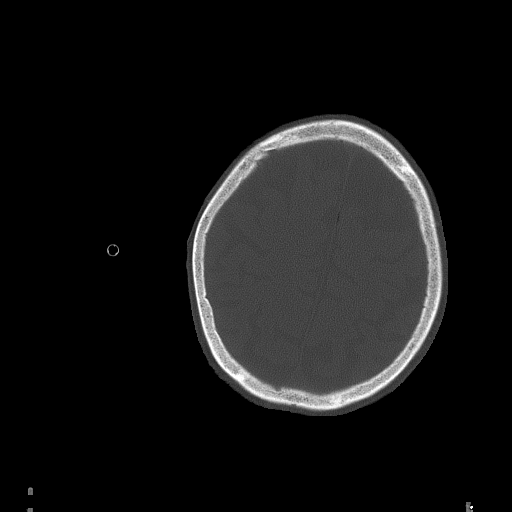
[im 70/87  bone]
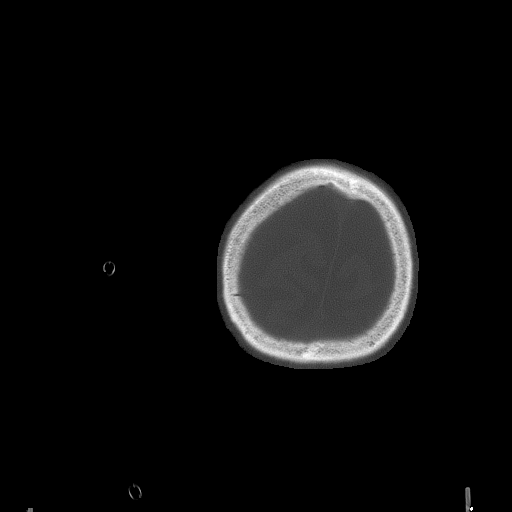
[im 81/87  bone]
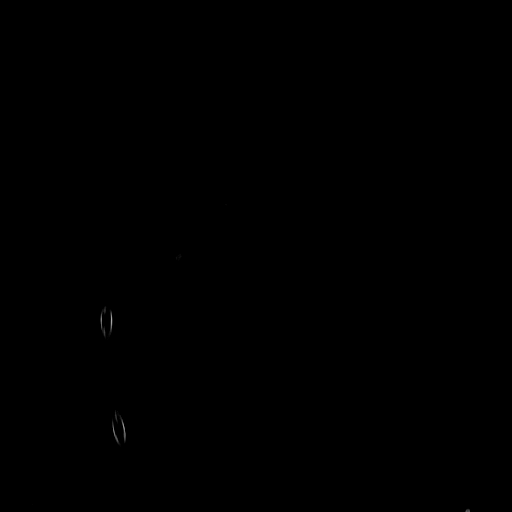

[Series 4: head wo2 · axial · 0.51mm/px · z∈[-184,-95]mm · 4 of 32 slices shown, 5 images]
[im 7/32  brain]
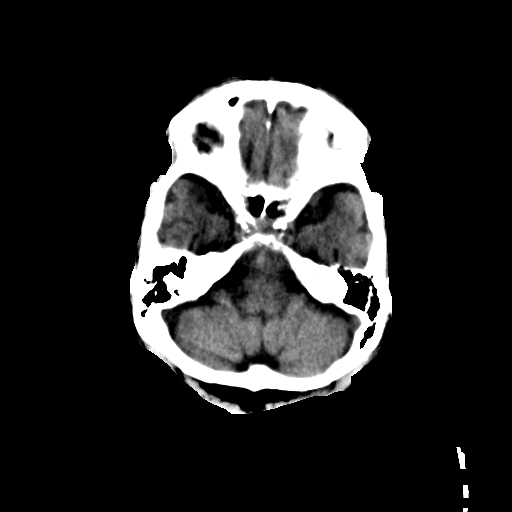
[im 7/32  bone]
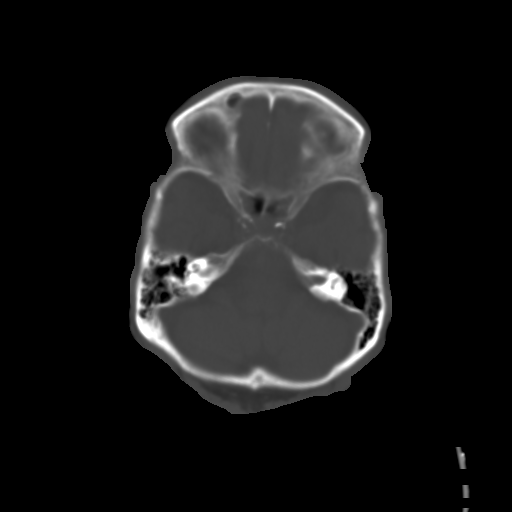
[im 13/32  brain]
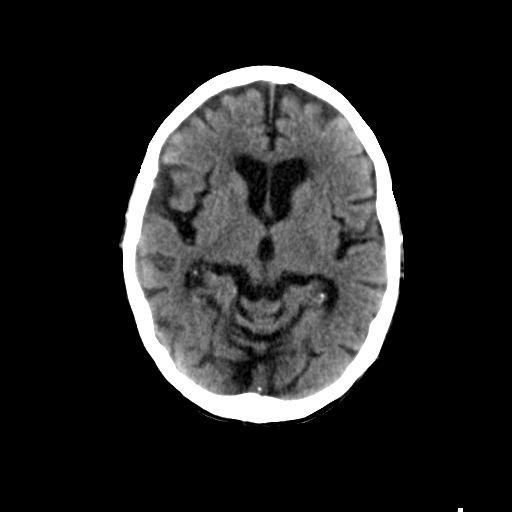
[im 19/32  brain]
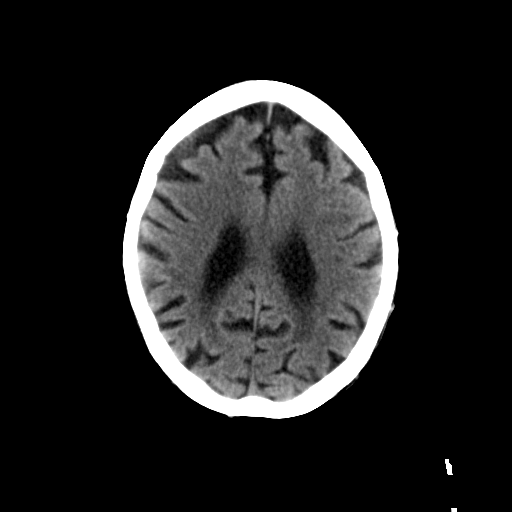
[im 25/32  brain]
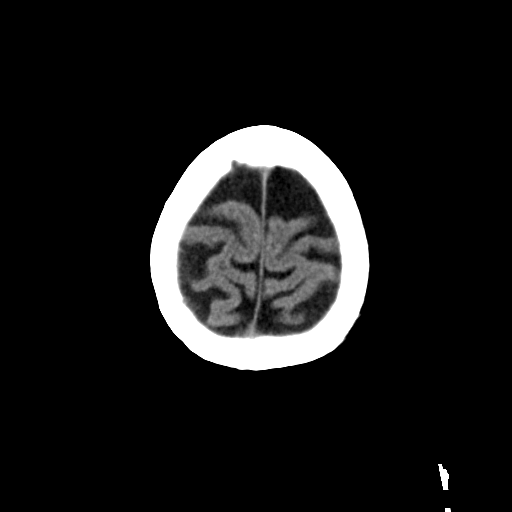

[17 of 30 positions shown; findings below may reference images not displayed]

FINDINGS: Prominent diffuse cerebral atrophy. Mild ventricular dilatation
consistent with central atrophy. Low-attenuation changes in the deep
white matter consistent with small vessel ischemia. No mass effect
or midline shift. No abnormal extra-axial fluid collections.
Gray-white matter junctions are distinct. Basal cisterns are not
effaced. No evidence of acute intracranial hemorrhage. No depressed
skull fractures. Visualized paranasal sinuses and mastoid air cells
are not opacified. Vascular calcifications.
IMPRESSION: No acute intracranial abnormalities. Chronic atrophy and small
vessel ischemic changes.

## 2016-07-04 IMAGING — CR DG CHEST 1V PORT
1 series · 1 of 1 positions shown · non-contrast
Comparison: 04/16/2015

CLINICAL DATA: Altered mental status

EXAM:
PORTABLE CHEST - 1 VIEW

[ap]
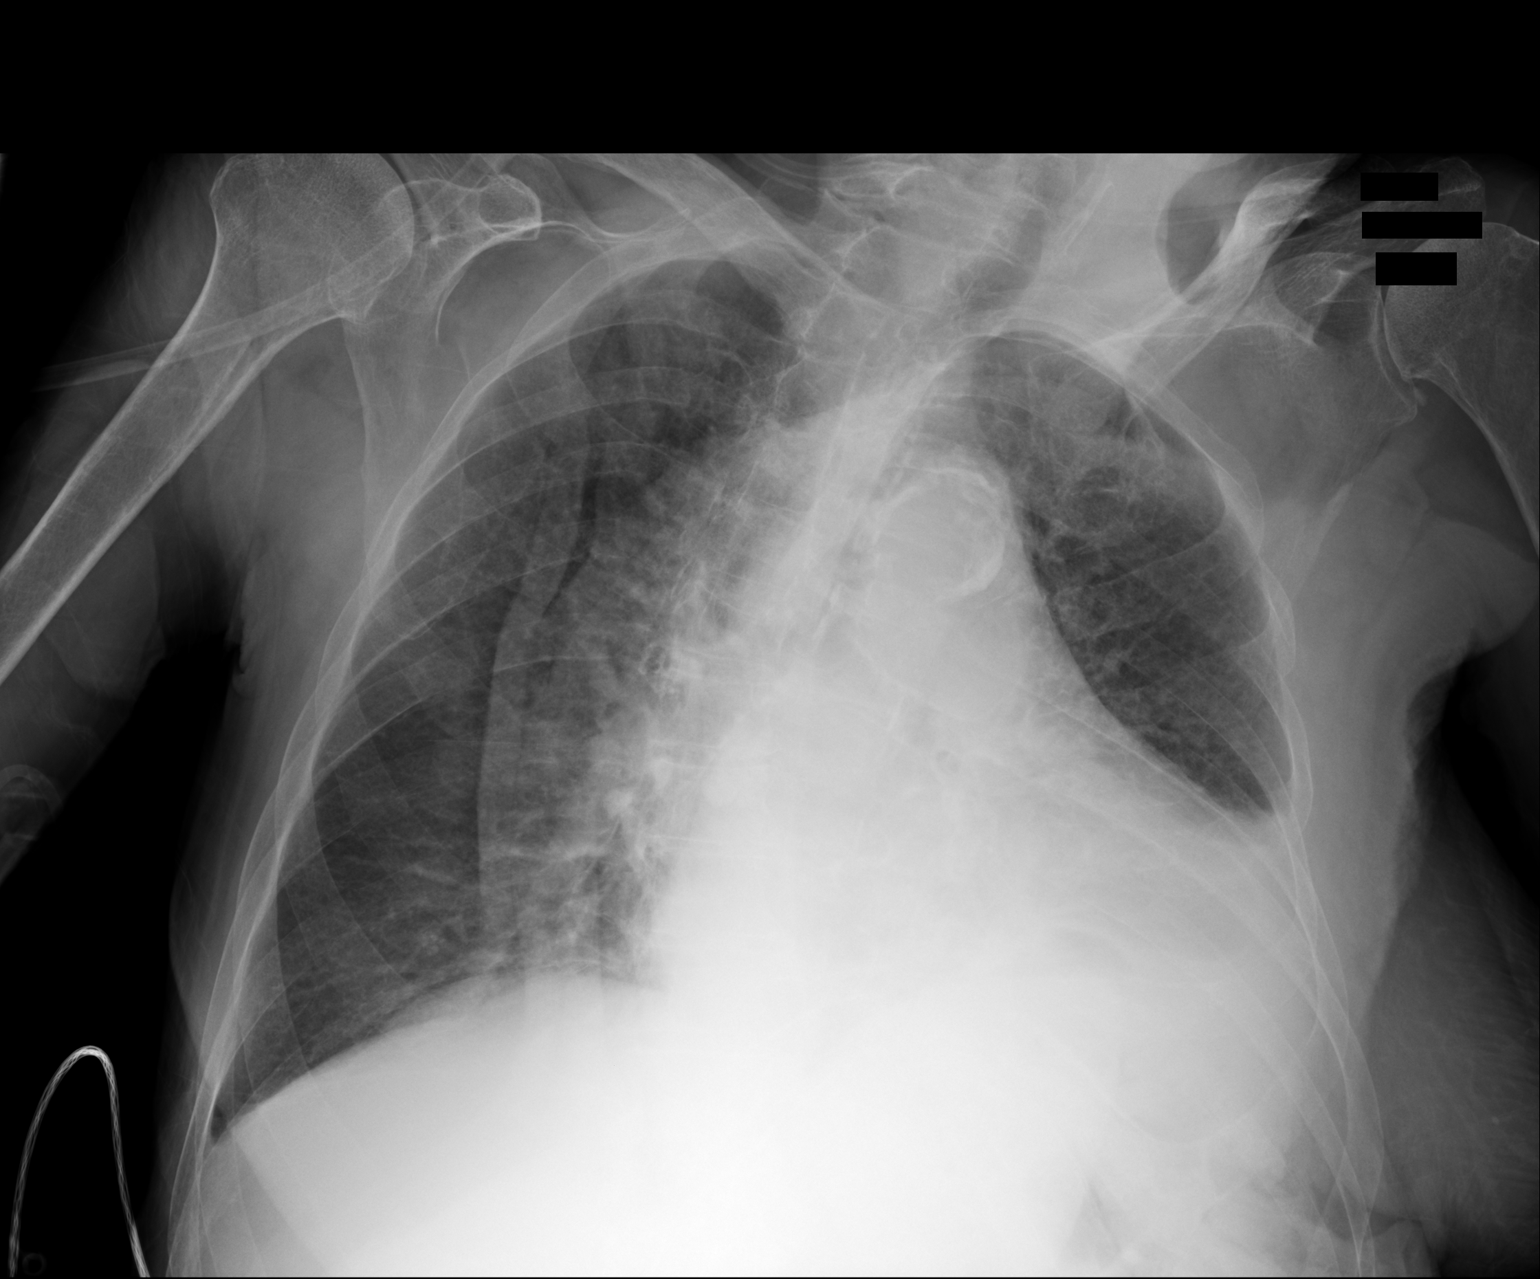

[1 of 1 positions shown; findings below may reference images not displayed]

FINDINGS: Cardiac enlargement. Pulmonary vascular congestion with improvement
in edema since the prior study.

Bibasilar atelectasis left greater than right. Possible small left
effusion.
IMPRESSION: Cardiac enlargement with vascular congestion. Resolving pulmonary
edema since the prior study

Bibasilar atelectasis left greater than right.
